# Patient Record
Sex: Female | Born: 1960 | Race: White | Hispanic: No | Marital: Married | Smoking: Never smoker
Health system: Southern US, Community
[De-identification: ages and names within clinical notes are randomized; demographics above are authoritative.]

## PROBLEM LIST (undated history)

## (undated) DIAGNOSIS — C801 Malignant (primary) neoplasm, unspecified: Secondary | ICD-10-CM

## (undated) DIAGNOSIS — J302 Other seasonal allergic rhinitis: Secondary | ICD-10-CM

## (undated) HISTORY — DX: Other seasonal allergic rhinitis: J30.2

---

## 2004-09-11 ENCOUNTER — Ambulatory Visit: Payer: Self-pay | Admitting: Internal Medicine

## 2006-02-17 ENCOUNTER — Ambulatory Visit: Payer: Self-pay | Admitting: Internal Medicine

## 2007-04-28 ENCOUNTER — Ambulatory Visit: Payer: Self-pay

## 2008-11-01 ENCOUNTER — Ambulatory Visit: Payer: Self-pay

## 2009-11-19 ENCOUNTER — Ambulatory Visit: Payer: Self-pay | Admitting: Internal Medicine

## 2010-01-04 ENCOUNTER — Ambulatory Visit: Payer: Self-pay | Admitting: Gastroenterology

## 2011-06-05 ENCOUNTER — Ambulatory Visit: Payer: Self-pay | Admitting: Internal Medicine

## 2012-04-06 ENCOUNTER — Encounter: Payer: Self-pay | Admitting: Internal Medicine

## 2012-04-06 ENCOUNTER — Telehealth: Payer: Self-pay | Admitting: *Deleted

## 2012-04-06 ENCOUNTER — Encounter: Payer: Self-pay | Admitting: *Deleted

## 2012-04-07 ENCOUNTER — Ambulatory Visit (INDEPENDENT_AMBULATORY_CARE_PROVIDER_SITE_OTHER): Payer: Self-pay | Admitting: Internal Medicine

## 2012-04-07 ENCOUNTER — Encounter: Payer: Self-pay | Admitting: Internal Medicine

## 2012-04-07 ENCOUNTER — Other Ambulatory Visit (HOSPITAL_COMMUNITY)
Admission: RE | Admit: 2012-04-07 | Discharge: 2012-04-07 | Disposition: A | Discharge status: Home / Self Care | Payer: Self-pay | Source: Ambulatory Visit | Attending: Internal Medicine | Admitting: Internal Medicine

## 2012-04-07 VITALS — BP 112/70 | HR 54 | Temp 98.2°F | Ht 63.0 in | Wt 122.8 lb

## 2012-04-07 DIAGNOSIS — N926 Irregular menstruation, unspecified: Secondary | ICD-10-CM

## 2012-04-07 DIAGNOSIS — R6889 Other general symptoms and signs: Secondary | ICD-10-CM

## 2012-04-07 DIAGNOSIS — R87619 Unspecified abnormal cytological findings in specimens from cervix uteri: Secondary | ICD-10-CM | POA: Insufficient documentation

## 2012-04-07 DIAGNOSIS — R195 Other fecal abnormalities: Secondary | ICD-10-CM

## 2012-04-07 DIAGNOSIS — IMO0002 Reserved for concepts with insufficient information to code with codable children: Secondary | ICD-10-CM

## 2012-04-07 DIAGNOSIS — Z139 Encounter for screening, unspecified: Secondary | ICD-10-CM

## 2012-04-07 DIAGNOSIS — Z01419 Encounter for gynecological examination (general) (routine) without abnormal findings: Secondary | ICD-10-CM | POA: Insufficient documentation

## 2012-04-07 DIAGNOSIS — Z1151 Encounter for screening for human papillomavirus (HPV): Secondary | ICD-10-CM | POA: Insufficient documentation

## 2012-04-07 LAB — CBC WITH DIFFERENTIAL/PLATELET
Basophils Relative: 0.5 % (ref 0.0–3.0)
Eosinophils Relative: 4.6 % (ref 0.0–5.0)
Lymphocytes Relative: 23.3 % (ref 12.0–46.0)
MCV: 86.7 fl (ref 78.0–100.0)
Monocytes Absolute: 0.5 10*3/uL (ref 0.1–1.0)
Monocytes Relative: 8.2 % (ref 3.0–12.0)
Neutrophils Relative %: 63.4 % (ref 43.0–77.0)
RBC: 4.58 Mil/uL (ref 3.87–5.11)
WBC: 6 10*3/uL (ref 4.5–10.5)

## 2012-04-07 NOTE — Progress Notes (Signed)
  Subjective:    Patient ID: Bridget Gordon, female    DOB: June 06, 1960, 52 y.o.   MRN: [id number]  HPI 52 year old female with past history of occasional seasonal allergies who comes in today for her physical exam.  She states she is doing well.  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  Is followed by Dr Caprice Beaver and [facility] and by Dr Roseanne Kaufman here.  Stays active. Runs.  No cardiac symptoms with increased activity or exertion.  States she has not had a period since 8/13.  (some spotting 10/13).  A rare hot flash.  No sleeping problems.  No bowel change.   Past Medical History  Diagnosis Date  . Seasonal allergies     Current Outpatient Prescriptions on File Prior to Visit  Medication Sig Dispense Refill  . calcium carbonate 200 MG capsule Take 250 mg by mouth daily.        Review of Systems Patient denies any headache, lightheadedness or dizziness.  No significant sinus or allergy symptoms.  No chest pain, tightness or palpitations.  No increased shortness of breath, cough or congestion.  No nausea or vomiting.  No acid reflux.  No abdominal pain or cramping.  No bowel change, such as diarrhea, constipation, BRBPR or melana.  No urine change.  No vaginal problems.  Periods as outlined.       Objective:   Physical Exam Filed Vitals:   04/07/12 0842  BP: 112/70  Pulse: 54  Temp: 98.2 F (47.80 C)   52 year old female in no acute distress.   HEENT:  Nares- clear.  Oropharynx - without lesions. NECK:  Supple.  Nontender.  No audible bruit.  HEART:  Appears to be regular. LUNGS:  No crackles or wheezing audible.  Respirations even and unlabored.  RADIAL PULSE:  Equal bilaterally.    BREASTS:  No nipple discharge or nipple retraction present.  Could not appreciate any distinct nodules or axillary adenopathy.  ABDOMEN:  Soft, nontender.  Bowel sounds present and normal.  No audible abdominal bruit.  GU:  Normal external genitalia.  Vaginal vault without lesions.  Cervix  identified.  Pap performed. Could not appreciate any adnexal masses or tenderness.   RECTAL:  Heme positive.   EXTREMITIES:  No increased edema present.  DP pulses palpable and equal bilaterally.           Assessment & Plan:  CARDIOVASCULAR.  Asymptomatic.   HEME POSITIVE.  Heme positive on exam today.  Check cbc/ferritin.  IFOB.  Discuss with GI. Colonoscopy 01/04/10 - diverticulosis.    HEALTH MAINTENANCE.  Physical today.  Pap today.  Schedule mammogram.  Last mammogram 06/05/11.  Colonoscopy 01/04/10 - diverticulosis otherwise normal.

## 2012-04-07 NOTE — Assessment & Plan Note (Signed)
History of ASCUS.  Last pap negative with negative HPV.   Pap today.

## 2012-04-07 NOTE — Assessment & Plan Note (Signed)
Periods as outlined.  Follow.    

## 2012-04-07 NOTE — Telephone Encounter (Signed)
error 

## 2012-04-11 ENCOUNTER — Telehealth: Payer: Self-pay | Admitting: Internal Medicine

## 2012-04-11 NOTE — Telephone Encounter (Signed)
Notified of labs and pap results via my chart.

## 2012-04-13 ENCOUNTER — Other Ambulatory Visit (INDEPENDENT_AMBULATORY_CARE_PROVIDER_SITE_OTHER): Payer: Self-pay

## 2012-04-13 DIAGNOSIS — Z139 Encounter for screening, unspecified: Secondary | ICD-10-CM

## 2012-04-13 LAB — FECAL OCCULT BLOOD, IMMUNOCHEMICAL: Fecal Occult Bld: NEGATIVE

## 2012-04-14 ENCOUNTER — Telehealth: Payer: Self-pay | Admitting: Internal Medicine

## 2012-04-14 ENCOUNTER — Encounter: Payer: Self-pay | Admitting: Internal Medicine

## 2012-04-14 NOTE — Telephone Encounter (Signed)
Pt notified of lab results via mychart. 

## 2012-04-15 ENCOUNTER — Telehealth: Payer: Self-pay | Admitting: Internal Medicine

## 2012-04-15 DIAGNOSIS — R195 Other fecal abnormalities: Secondary | ICD-10-CM

## 2012-04-15 NOTE — Telephone Encounter (Signed)
Called pt and discussed referral to gi.  She is in agreement.  Order for referral placed.

## 2012-04-17 ENCOUNTER — Other Ambulatory Visit: Payer: Self-pay

## 2013-01-06 ENCOUNTER — Other Ambulatory Visit: Payer: Self-pay

## 2013-04-08 ENCOUNTER — Encounter: Payer: Self-pay | Admitting: Internal Medicine

## 2013-04-15 ENCOUNTER — Encounter: Payer: Self-pay | Admitting: Internal Medicine

## 2013-04-15 ENCOUNTER — Ambulatory Visit (INDEPENDENT_AMBULATORY_CARE_PROVIDER_SITE_OTHER): Payer: Self-pay | Admitting: Internal Medicine

## 2013-04-15 VITALS — BP 120/80 | HR 59 | Temp 98.2°F | Ht 63.5 in | Wt 126.0 lb

## 2013-04-15 DIAGNOSIS — Z13220 Encounter for screening for lipoid disorders: Secondary | ICD-10-CM

## 2013-04-15 DIAGNOSIS — N951 Menopausal and female climacteric states: Secondary | ICD-10-CM

## 2013-04-15 DIAGNOSIS — Z1211 Encounter for screening for malignant neoplasm of colon: Secondary | ICD-10-CM

## 2013-04-15 DIAGNOSIS — N926 Irregular menstruation, unspecified: Secondary | ICD-10-CM

## 2013-04-15 DIAGNOSIS — R232 Flushing: Secondary | ICD-10-CM

## 2013-04-15 NOTE — Assessment & Plan Note (Addendum)
Periods as outlined.  Follow.

## 2013-04-15 NOTE — Progress Notes (Signed)
Pre-visit discussion using our clinic review tool. No additional management support is needed unless otherwise documented below in the visit note.  

## 2013-04-15 NOTE — Assessment & Plan Note (Addendum)
History of ASCUS.  Last pap negative with negative HPV.   Pap 04/07/12 negative with negative HPV.

## 2013-04-18 ENCOUNTER — Encounter: Payer: Self-pay | Admitting: Internal Medicine

## 2013-04-19 ENCOUNTER — Encounter: Payer: Self-pay | Admitting: Internal Medicine

## 2013-04-19 DIAGNOSIS — R232 Flushing: Secondary | ICD-10-CM | POA: Insufficient documentation

## 2013-04-19 NOTE — Assessment & Plan Note (Signed)
Discussed at length with her today.  Discussed treatment options.  She preferred to discuss more with Chapman Fitch.

## 2013-04-19 NOTE — Progress Notes (Signed)
  Subjective:    Patient ID: Bridget Gordon, female    DOB: 12-25-1960, 53 y.o.   MRN: [id number]  HPI 53 year old female with past history of occasional seasonal allergies who comes in today for her physical exam.  She states she is doing well.  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  Is followed by Dr Anne Fu at [facility] and by Dr Kellie Moor here.  Stays active. Runs.  No cardiac symptoms with increased activity or exertion.  States she had not had a period since 8/13 except for some spotting 10/13.  She then had a normal period 8/14.  No bleeding since.  does have some hot flashes.  We discussed treatment options.  She preferred to talk with Chapman Fitch for evaluation.  No sleeping problems.  No bowel change.    Past Medical History  Diagnosis Date  . Seasonal allergies     Current Outpatient Prescriptions on File Prior to Visit  Medication Sig Dispense Refill  . calcium carbonate 200 MG capsule Take 250 mg by mouth daily.      Marland Kitchen VITAMIN D, ERGOCALCIFEROL, PO Take by mouth daily.       No current facility-administered medications on file prior to visit.    Review of Systems Patient denies any headache, lightheadedness or dizziness.  No significant sinus or allergy symptoms.  No chest pain, tightness or palpitations.  No increased shortness of breath, cough or congestion.  No nausea or vomiting.  No acid reflux.  No abdominal pain or cramping.  No bowel change, such as diarrhea, constipation, BRBPR or melana.  No urine change.  No vaginal problems.  Periods as outlined.  Some hot flashes.       Objective:   Physical Exam  Filed Vitals:   04/15/13 1515  BP: 120/80  Pulse: 59  Temp: 98.2 F (39.6 C)   53 year old female in no acute distress.   HEENT:  Nares- clear.  Oropharynx - without lesions. NECK:  Supple.  Nontender.  No audible bruit.  HEART:  Appears to be regular. LUNGS:  No crackles or wheezing audible.  Respirations even and unlabored.  RADIAL PULSE:  Equal  bilaterally.    BREASTS:  No nipple discharge or nipple retraction present.  Could not appreciate any distinct nodules or axillary adenopathy.  ABDOMEN:  Soft, nontender.  Bowel sounds present and normal.  No audible abdominal bruit.  GU:  Not performed.    EXTREMITIES:  No increased edema present.  DP pulses palpable and equal bilaterally.           Assessment & Plan:  CARDIOVASCULAR.  Asymptomatic.   GI.   IFOB.  Colonoscopy 01/04/10 - diverticulosis.    HEALTH MAINTENANCE.  Physical today.  Pap 04/07/12 negative with negative HPV.   Schedule mammogram.  Last mammogram 06/05/11.  Did not get her mammogram last year.  Overdue.  Colonoscopy 01/04/10 - diverticulosis otherwise normal.

## 2013-04-28 ENCOUNTER — Other Ambulatory Visit: Payer: Self-pay

## 2013-05-04 ENCOUNTER — Ambulatory Visit: Payer: Self-pay | Admitting: Internal Medicine

## 2013-05-04 LAB — HM MAMMOGRAPHY: HM MAMMO: NEGATIVE

## 2013-05-05 ENCOUNTER — Encounter: Payer: Self-pay | Admitting: Internal Medicine

## 2013-05-06 ENCOUNTER — Other Ambulatory Visit (INDEPENDENT_AMBULATORY_CARE_PROVIDER_SITE_OTHER): Payer: Self-pay

## 2013-05-06 DIAGNOSIS — Z13220 Encounter for screening for lipoid disorders: Secondary | ICD-10-CM

## 2013-05-06 DIAGNOSIS — N926 Irregular menstruation, unspecified: Secondary | ICD-10-CM

## 2013-05-06 LAB — CBC WITH DIFFERENTIAL/PLATELET
Basophils Absolute: 0 K/uL (ref 0.0–0.1)
Basophils Relative: 0.3 % (ref 0.0–3.0)
Eosinophils Absolute: 1 K/uL — ABNORMAL HIGH (ref 0.0–0.7)
Eosinophils Relative: 13.2 % — ABNORMAL HIGH (ref 0.0–5.0)
HCT: 39.3 % (ref 36.0–46.0)
Hemoglobin: 13.3 g/dL (ref 12.0–15.0)
Lymphocytes Relative: 20 % (ref 12.0–46.0)
Lymphs Abs: 1.5 K/uL (ref 0.7–4.0)
MCHC: 33.8 g/dL (ref 30.0–36.0)
MCV: 87.1 fl (ref 78.0–100.0)
Monocytes Absolute: 0.4 K/uL (ref 0.1–1.0)
Monocytes Relative: 5.6 % (ref 3.0–12.0)
Neutro Abs: 4.5 K/uL (ref 1.4–7.7)
Neutrophils Relative %: 60.9 % (ref 43.0–77.0)
Platelets: 288 K/uL (ref 150.0–400.0)
RBC: 4.51 Mil/uL (ref 3.87–5.11)
RDW: 12.7 % (ref 11.5–14.6)
WBC: 7.5 K/uL (ref 4.5–10.5)

## 2013-05-06 LAB — LIPID PANEL
Cholesterol: 197 mg/dL (ref 0–200)
HDL: 67.9 mg/dL (ref >39.00)
LDL Cholesterol: 120 mg/dL — ABNORMAL HIGH (ref 0–99)
Total CHOL/HDL Ratio: 3
Triglycerides: 48 mg/dL (ref 0.0–149.0)
VLDL: 9.6 mg/dL (ref 0.0–40.0)

## 2013-05-06 LAB — COMPREHENSIVE METABOLIC PANEL WITH GFR
ALT: 26 U/L (ref 0–35)
AST: 18 U/L (ref 0–37)
Albumin: 3.9 g/dL (ref 3.5–5.2)
Alkaline Phosphatase: 64 U/L (ref 39–117)
BUN: 9 mg/dL (ref 6–23)
CO2: 32 meq/L (ref 19–32)
Calcium: 8.8 mg/dL (ref 8.4–10.5)
Chloride: 104 meq/L (ref 96–112)
Creatinine, Ser: 0.7 mg/dL (ref 0.4–1.2)
GFR: 91.32 mL/min
Glucose, Bld: 72 mg/dL (ref 70–99)
Potassium: 4 meq/L (ref 3.5–5.1)
Sodium: 139 meq/L (ref 135–145)
Total Bilirubin: 1 mg/dL (ref 0.3–1.2)
Total Protein: 6.7 g/dL (ref 6.0–8.3)

## 2013-05-06 LAB — TSH: TSH: 0.89 u[IU]/mL (ref 0.35–5.50)

## 2013-05-07 ENCOUNTER — Encounter: Payer: Self-pay | Admitting: Internal Medicine

## 2013-05-26 ENCOUNTER — Encounter: Payer: Self-pay | Admitting: Internal Medicine

## 2013-05-26 ENCOUNTER — Other Ambulatory Visit (INDEPENDENT_AMBULATORY_CARE_PROVIDER_SITE_OTHER): Payer: Self-pay

## 2013-05-26 DIAGNOSIS — Z1211 Encounter for screening for malignant neoplasm of colon: Secondary | ICD-10-CM

## 2013-05-26 LAB — FECAL OCCULT BLOOD, IMMUNOCHEMICAL: FECAL OCCULT BLD: NEGATIVE

## 2013-05-31 NOTE — Telephone Encounter (Signed)
Mailed unread message to pt  

## 2014-02-02 ENCOUNTER — Encounter: Payer: Self-pay | Admitting: Internal Medicine

## 2014-04-17 ENCOUNTER — Encounter: Payer: Self-pay | Admitting: Internal Medicine

## 2014-04-20 ENCOUNTER — Encounter: Payer: Self-pay | Admitting: Internal Medicine

## 2014-04-20 ENCOUNTER — Ambulatory Visit (INDEPENDENT_AMBULATORY_CARE_PROVIDER_SITE_OTHER): Payer: Self-pay | Admitting: Internal Medicine

## 2014-04-20 VITALS — BP 118/80 | HR 64 | Temp 98.3°F | Ht 63.0 in | Wt 129.4 lb

## 2014-04-20 DIAGNOSIS — R232 Flushing: Secondary | ICD-10-CM

## 2014-04-20 DIAGNOSIS — N951 Menopausal and female climacteric states: Secondary | ICD-10-CM

## 2014-04-20 DIAGNOSIS — R87619 Unspecified abnormal cytological findings in specimens from cervix uteri: Secondary | ICD-10-CM

## 2014-04-20 DIAGNOSIS — Z0000 Encounter for general adult medical examination without abnormal findings: Secondary | ICD-10-CM

## 2014-04-20 NOTE — Progress Notes (Signed)
Pre visit review using our clinic review tool, if applicable. No additional management support is needed unless otherwise documented below in the visit note. 

## 2014-04-24 ENCOUNTER — Encounter: Payer: Self-pay | Admitting: Internal Medicine

## 2014-04-24 DIAGNOSIS — Z0000 Encounter for general adult medical examination without abnormal findings: Secondary | ICD-10-CM | POA: Insufficient documentation

## 2014-04-24 NOTE — Assessment & Plan Note (Signed)
History of ASCUS.  Last pap 04/07/12 - negative with negative HPV.

## 2014-04-24 NOTE — Progress Notes (Signed)
Patient ID: Bridget Gordon, female   DOB: May 04, 1960, 54 y.o.   MRN: [id number]   Subjective:    Patient ID: Bridget Gordon, female    DOB: 31-Jul-1960, 54 y.o.   MRN: [id number]  HPI  Patient here for her physical exam.  She states she is doing well.  Hot flashes are better.  Sleeping well.  No cardiac symptoms with increased activity or exertion.  Breathing stable.     Past Medical History  Diagnosis Date  . Seasonal allergies     Outpatient Encounter Prescriptions as of 04/20/2014  Medication Sig  . calcium carbonate 200 MG capsule Take 250 mg by mouth daily.  . Multiple Vitamin (MULTIVITAMIN) tablet Take 1 tablet by mouth daily.  Marland Kitchen VITAMIN D, ERGOCALCIFEROL, PO Take by mouth daily.    Review of Systems  Constitutional: Negative for appetite change and unexpected weight change.  HENT: Negative for congestion and sinus pressure.   Respiratory: Negative for cough, chest tightness and shortness of breath.   Cardiovascular: Negative for chest pain, palpitations and leg swelling.  Gastrointestinal: Negative for nausea, vomiting, abdominal pain and diarrhea.  Genitourinary: Negative for dysuria and difficulty urinating.  Neurological: Negative for dizziness, light-headedness and headaches.       Objective:    Physical Exam  Constitutional: She is oriented to person, place, and time. She appears well-developed and well-nourished.  HENT:  Nose: Nose normal.  Mouth/Throat: Oropharynx is clear and moist.  Eyes: Right eye exhibits no discharge. Left eye exhibits no discharge. No scleral icterus.  Neck: Neck supple. No thyromegaly present.  Cardiovascular: Normal rate and regular rhythm.   Pulmonary/Chest: Breath sounds normal. No accessory muscle usage. No tachypnea. No respiratory distress. She has no decreased breath sounds. She has no wheezes. She has no rhonchi. Right breast exhibits no inverted nipple, no mass, no nipple discharge and no tenderness (no axillary adenopathy). Left  breast exhibits no inverted nipple, no mass, no nipple discharge and no tenderness (no axilarry adenopathy).  Abdominal: Soft. Bowel sounds are normal. There is no tenderness.  Musculoskeletal: She exhibits no edema or tenderness.  Lymphadenopathy:    She has no cervical adenopathy.  Neurological: She is alert and oriented to person, place, and time.  Skin: Skin is warm. No rash noted.  Psychiatric: She has a normal mood and affect. Her behavior is normal.    BP 118/80 mmHg  Pulse 64  Temp(Src) 98.3 F (36.8 C) (Oral)  Ht 5\' 3"  (1.6 m)  Wt 129 lb 6 oz (58.684 kg)  BMI 22.92 kg/m2  SpO2 97%  LMP 10/13/2012 Wt Readings from Last 3 Encounters:  04/20/14 129 lb 6 oz (58.684 kg)  04/15/13 126 lb (57.153 kg)  04/07/12 122 lb 12 oz (55.679 kg)     Lab Results  Component Value Date   WBC 7.5 05/06/2013   HGB 13.3 05/06/2013   HCT 39.3 05/06/2013   PLT 288.0 05/06/2013   GLUCOSE 72 05/06/2013   CHOL 197 05/06/2013   TRIG 48.0 05/06/2013   HDL 67.90 05/06/2013   LDLCALC 120* 05/06/2013   ALT 26 05/06/2013   AST 18 05/06/2013   NA 139 05/06/2013   K 4.0 05/06/2013   CL 104 05/06/2013   CREATININE 0.7 05/06/2013   BUN 9 05/06/2013   CO2 32 05/06/2013   TSH 0.89 05/06/2013       Assessment & Plan:   Problem List Items Addressed This Visit    Abnormal Pap smear of cervix - Primary  History of ASCUS.  Last pap 04/07/12 - negative with negative HPV.        Health care maintenance    Mammogram 05/04/13 - Birads I.  Schedule f/u mammogram.  PAP 04/07/12 negative pap with negative HPV.  Colonoscopy 01/04/10.        Hot flashes    Better now.  Sleeping well.           Einar Pheasant, MD

## 2014-04-24 NOTE — Assessment & Plan Note (Signed)
Mammogram 05/04/13 - Birads I.  Schedule f/u mammogram.  PAP 04/07/12 negative pap with negative HPV.  Colonoscopy 01/04/10.

## 2014-04-24 NOTE — Assessment & Plan Note (Signed)
Better now.  Sleeping well.

## 2014-05-31 ENCOUNTER — Ambulatory Visit
Admit: 2014-05-31 | Disposition: A | Discharge status: Home / Self Care | Payer: Self-pay | Attending: Internal Medicine | Admitting: Internal Medicine

## 2014-05-31 LAB — HM MAMMOGRAPHY: HM MAMMO: NEGATIVE

## 2014-06-27 ENCOUNTER — Encounter: Payer: Self-pay | Admitting: Internal Medicine

## 2015-04-23 ENCOUNTER — Encounter: Payer: Self-pay | Admitting: Internal Medicine

## 2015-04-23 ENCOUNTER — Other Ambulatory Visit (HOSPITAL_COMMUNITY)
Admission: RE | Admit: 2015-04-23 | Discharge: 2015-04-23 | Disposition: A | Discharge status: Home / Self Care | Payer: Self-pay | Source: Ambulatory Visit | Attending: Internal Medicine | Admitting: Internal Medicine

## 2015-04-23 ENCOUNTER — Ambulatory Visit (INDEPENDENT_AMBULATORY_CARE_PROVIDER_SITE_OTHER): Payer: Self-pay | Admitting: Internal Medicine

## 2015-04-23 VITALS — BP 120/80 | HR 67 | Temp 98.2°F | Resp 18 | Ht 63.0 in | Wt 127.0 lb

## 2015-04-23 DIAGNOSIS — R195 Other fecal abnormalities: Secondary | ICD-10-CM

## 2015-04-23 DIAGNOSIS — Z0000 Encounter for general adult medical examination without abnormal findings: Secondary | ICD-10-CM

## 2015-04-23 DIAGNOSIS — Z1151 Encounter for screening for human papillomavirus (HPV): Secondary | ICD-10-CM | POA: Insufficient documentation

## 2015-04-23 DIAGNOSIS — Z124 Encounter for screening for malignant neoplasm of cervix: Secondary | ICD-10-CM

## 2015-04-23 DIAGNOSIS — Z01419 Encounter for gynecological examination (general) (routine) without abnormal findings: Secondary | ICD-10-CM | POA: Insufficient documentation

## 2015-04-23 DIAGNOSIS — R87619 Unspecified abnormal cytological findings in specimens from cervix uteri: Secondary | ICD-10-CM

## 2015-04-23 DIAGNOSIS — Z13220 Encounter for screening for lipoid disorders: Secondary | ICD-10-CM

## 2015-04-23 DIAGNOSIS — Z1239 Encounter for other screening for malignant neoplasm of breast: Secondary | ICD-10-CM

## 2015-04-23 NOTE — Assessment & Plan Note (Signed)
History of ASCUS.  Last pap 04/07/12 - negative with negative HPV.  Repeat pap today 04/23/15.

## 2015-04-23 NOTE — Assessment & Plan Note (Signed)
Noted on exam.  Last colonoscopy 2011.  Refer to GI for evaluation and question of need for colonoscopy.  Check cbc and ferritin.

## 2015-04-23 NOTE — Progress Notes (Signed)
Pre-visit discussion using our clinic review tool. No additional management support is needed unless otherwise documented below in the visit note.  

## 2015-04-23 NOTE — Assessment & Plan Note (Signed)
Physical today 04/23/15.  PAP 04/23/15.  Colonoscopy 01/04/10.  Recommended f/u in 10 years.  Heme positive on exam.  Refer to GI for evaluation and question of need for f/u colonoscopy.  She will schedule mammogram.  Order placed.

## 2015-04-23 NOTE — Progress Notes (Signed)
Patient ID: VASHTIE FORST, female   DOB: Oct 10, 1960, 55 y.o.   MRN: [id number]   Subjective:    Patient ID: MYLO KOWALKE, female    DOB: 1960/09/11, 55 y.o.   MRN: [id number]  HPI  Patient here for her physical exam.  She is doing well.  Feels good.  Stays active.  No cardiac symptoms with increased activity or exertion.  No sob.  No significant cough or congestion.  No abdominal pain or cramping.  Bowels doing well.  No vaginal problems.  No vaginal bleeding.  Has been 18 months since last period.  Occasional problems with hemorrhoids.  No significant bowel issues.     Past Medical History  Diagnosis Date  . Seasonal allergies    No past surgical history on file. Family History  Problem Relation Age of Onset  . Heart disease Father     s/p CABG  . Skin cancer Father   . Skin cancer Mother   . Diabetes Maternal Grandmother   . Bone cancer Maternal Grandmother   . Liver cancer Maternal Grandfather   . Breast cancer Neg Hx   . Colon cancer Neg Hx    Social History   Social History  . Marital Status: Married    Spouse Name: N/A  . Number of Children: 7  . Years of Education: N/A   Social History Main Topics  . Smoking status: Never Smoker   . Smokeless tobacco: Never Used  . Alcohol Use: No  . Drug Use: No  . Sexual Activity: Not Asked   Other Topics Concern  . None   Social History Narrative    Outpatient Encounter Prescriptions as of 04/23/2015  Medication Sig  . calcium carbonate 200 MG capsule Take 250 mg by mouth daily.  . Multiple Vitamin (MULTIVITAMIN) tablet Take 1 tablet by mouth daily.  Marland Kitchen VITAMIN D, ERGOCALCIFEROL, PO Take by mouth daily.   No facility-administered encounter medications on file as of 04/23/2015.    Review of Systems  Constitutional: Negative for appetite change and unexpected weight change.  HENT: Negative for congestion and sinus pressure.   Eyes: Negative for pain and visual disturbance.  Respiratory: Negative for cough, chest  tightness and shortness of breath.   Cardiovascular: Negative for chest pain, palpitations and leg swelling.  Gastrointestinal: Negative for nausea, vomiting, abdominal pain and diarrhea.  Genitourinary: Negative for dysuria, vaginal bleeding and difficulty urinating.  Musculoskeletal: Negative for back pain and joint swelling.  Skin: Negative for color change and rash.  Neurological: Negative for dizziness, light-headedness and headaches.  Hematological: Negative for adenopathy. Does not bruise/bleed easily.  Psychiatric/Behavioral: Negative for dysphoric mood and agitation.       Objective:    Physical Exam  Constitutional: She is oriented to person, place, and time. She appears well-developed and well-nourished. No distress.  HENT:  Nose: Nose normal.  Mouth/Throat: Oropharynx is clear and moist.  Eyes: Right eye exhibits no discharge. Left eye exhibits no discharge. No scleral icterus.  Neck: Neck supple. No thyromegaly present.  Cardiovascular: Normal rate and regular rhythm.   Pulmonary/Chest: Breath sounds normal. No accessory muscle usage. No tachypnea. No respiratory distress. She has no decreased breath sounds. She has no wheezes. She has no rhonchi. Right breast exhibits no inverted nipple, no mass, no nipple discharge and no tenderness (no axillary adenopathy). Left breast exhibits no inverted nipple, no mass, no nipple discharge and no tenderness (no axilarry adenopathy).  Abdominal: Soft. Bowel sounds are normal. There is no  tenderness.  Genitourinary:  Normal external genitalia.  Vaginal vault without lesions.  Cervix identified.  Pap smear performed.  Could not appreciate any adnexal masses or tenderness.  Rectal exam - heme positive.    Musculoskeletal: She exhibits no edema or tenderness.  Lymphadenopathy:    She has no cervical adenopathy.  Neurological: She is alert and oriented to person, place, and time.  Skin: Skin is warm. No rash noted. No erythema.    Psychiatric: She has a normal mood and affect. Her behavior is normal.    BP 120/80 mmHg  Pulse 67  Temp(Src) 98.2 F (36.8 C) (Oral)  Resp 18  Ht 5\' 3"  (1.6 m)  Wt 127 lb (57.607 kg)  BMI 22.50 kg/m2  SpO2 98%  LMP 10/13/2012 Wt Readings from Last 3 Encounters:  04/23/15 127 lb (57.607 kg)  04/20/14 129 lb 6 oz (58.684 kg)  04/15/13 126 lb (57.153 kg)     Lab Results  Component Value Date   WBC 7.5 05/06/2013   HGB 13.3 05/06/2013   HCT 39.3 05/06/2013   PLT 288.0 05/06/2013   GLUCOSE 72 05/06/2013   CHOL 197 05/06/2013   TRIG 48.0 05/06/2013   HDL 67.90 05/06/2013   LDLCALC 120* 05/06/2013   ALT 26 05/06/2013   AST 18 05/06/2013   NA 139 05/06/2013   K 4.0 05/06/2013   CL 104 05/06/2013   CREATININE 0.7 05/06/2013   BUN 9 05/06/2013   CO2 32 05/06/2013   TSH 0.89 05/06/2013       Assessment & Plan:   Problem List Items Addressed This Visit    Abnormal Pap smear of cervix    History of ASCUS.  Last pap 04/07/12 - negative with negative HPV.  Repeat pap today 04/23/15.        Health care maintenance - Primary    Physical today 04/23/15.  PAP 04/23/15.  Colonoscopy 01/04/10.  Recommended f/u in 10 years.  Heme positive on exam.  Refer to GI for evaluation and question of need for f/u colonoscopy.  She will schedule mammogram.  Order placed.        Heme positive stool    Noted on exam.  Last colonoscopy 2011.  Refer to GI for evaluation and question of need for colonoscopy.  Check cbc and ferritin.        Relevant Orders   CBC with Differential/Platelet   Comprehensive metabolic panel   TSH   Ferritin   Ambulatory referral to Gastroenterology    Other Visit Diagnoses    Pap smear for cervical cancer screening        Relevant Orders    Cytology - PAP    Screening breast examination        Relevant Orders    MM DIGITAL SCREENING BILATERAL    Screening cholesterol level        Relevant Orders    Lipid panel        Einar Pheasant, MD

## 2015-04-26 LAB — CYTOLOGY - PAP

## 2015-04-27 ENCOUNTER — Encounter: Payer: Self-pay | Admitting: Internal Medicine

## 2015-05-08 ENCOUNTER — Encounter: Payer: Self-pay | Admitting: Internal Medicine

## 2015-05-09 ENCOUNTER — Encounter: Payer: Self-pay | Admitting: Internal Medicine

## 2015-05-09 NOTE — Telephone Encounter (Signed)
Pt states that she has had a stye since last week she started using warm compressed but by this weekend it got worse. Pt has swelling of the eyelid, under eye swelling, puffiness. Pt thinks it may be infected. Please advise, thanks

## 2015-05-09 NOTE — Telephone Encounter (Signed)
I can see her tomorrow at 1:15

## 2015-05-10 ENCOUNTER — Ambulatory Visit: Payer: Self-pay | Admitting: Family Medicine

## 2015-05-10 ENCOUNTER — Ambulatory Visit: Payer: Self-pay | Admitting: Internal Medicine

## 2015-05-10 ENCOUNTER — Encounter: Payer: Self-pay | Admitting: Internal Medicine

## 2015-05-11 ENCOUNTER — Other Ambulatory Visit (INDEPENDENT_AMBULATORY_CARE_PROVIDER_SITE_OTHER): Payer: Self-pay

## 2015-05-11 DIAGNOSIS — R195 Other fecal abnormalities: Secondary | ICD-10-CM

## 2015-05-11 DIAGNOSIS — Z13220 Encounter for screening for lipoid disorders: Secondary | ICD-10-CM

## 2015-05-11 LAB — LIPID PANEL
CHOL/HDL RATIO: 3
CHOLESTEROL: 195 mg/dL (ref 0–200)
HDL: 66 mg/dL (ref >39.00)
LDL CALC: 122 mg/dL — AB (ref 0–99)
NonHDL: 129.43
TRIGLYCERIDES: 39 mg/dL (ref 0.0–149.0)
VLDL: 7.8 mg/dL (ref 0.0–40.0)

## 2015-05-11 LAB — COMPREHENSIVE METABOLIC PANEL
ALT: 24 U/L (ref 0–35)
AST: 24 U/L (ref 0–37)
Albumin: 4.4 g/dL (ref 3.5–5.2)
Alkaline Phosphatase: 79 U/L (ref 39–117)
BUN: 13 mg/dL (ref 6–23)
CALCIUM: 9.3 mg/dL (ref 8.4–10.5)
CO2: 30 meq/L (ref 19–32)
Chloride: 103 mEq/L (ref 96–112)
Creatinine, Ser: 0.78 mg/dL (ref 0.40–1.20)
GFR: 81.32 mL/min (ref >60.00)
GLUCOSE: 81 mg/dL (ref 70–99)
POTASSIUM: 4.1 meq/L (ref 3.5–5.1)
Sodium: 140 mEq/L (ref 135–145)
Total Bilirubin: 0.5 mg/dL (ref 0.2–1.2)
Total Protein: 7.3 g/dL (ref 6.0–8.3)

## 2015-05-11 LAB — CBC WITH DIFFERENTIAL/PLATELET
BASOS PCT: 0.6 % (ref 0.0–3.0)
Basophils Absolute: 0 10*3/uL (ref 0.0–0.1)
EOS PCT: 5.4 % — AB (ref 0.0–5.0)
Eosinophils Absolute: 0.3 10*3/uL (ref 0.0–0.7)
HEMATOCRIT: 39.2 % (ref 36.0–46.0)
Hemoglobin: 13.3 g/dL (ref 12.0–15.0)
LYMPHS ABS: 1.7 10*3/uL (ref 0.7–4.0)
Lymphocytes Relative: 28.8 % (ref 12.0–46.0)
MCHC: 33.9 g/dL (ref 30.0–36.0)
MCV: 84.3 fl (ref 78.0–100.0)
MONOS PCT: 6.6 % (ref 3.0–12.0)
Monocytes Absolute: 0.4 10*3/uL (ref 0.1–1.0)
NEUTROS ABS: 3.4 10*3/uL (ref 1.4–7.7)
NEUTROS PCT: 58.6 % (ref 43.0–77.0)
PLATELETS: 295 10*3/uL (ref 150.0–400.0)
RBC: 4.65 Mil/uL (ref 3.87–5.11)
RDW: 12.8 % (ref 11.5–15.5)
WBC: 5.8 10*3/uL (ref 4.0–10.5)

## 2015-05-11 LAB — TSH: TSH: 0.8 u[IU]/mL (ref 0.35–4.50)

## 2015-05-11 LAB — FERRITIN: FERRITIN: 12.1 ng/mL (ref 10.0–291.0)

## 2015-05-12 ENCOUNTER — Encounter: Payer: Self-pay | Admitting: Internal Medicine

## 2015-06-04 ENCOUNTER — Ambulatory Visit
Admission: RE | Admit: 2015-06-04 | Discharge: 2015-06-04 | Disposition: A | Discharge status: Home / Self Care | Payer: Self-pay | Source: Ambulatory Visit | Attending: Internal Medicine | Admitting: Internal Medicine

## 2015-06-04 DIAGNOSIS — Z1239 Encounter for other screening for malignant neoplasm of breast: Secondary | ICD-10-CM

## 2015-06-04 DIAGNOSIS — Z1231 Encounter for screening mammogram for malignant neoplasm of breast: Secondary | ICD-10-CM | POA: Insufficient documentation

## 2015-06-04 HISTORY — DX: Malignant (primary) neoplasm, unspecified: C80.1

## 2015-06-05 ENCOUNTER — Encounter: Payer: Self-pay | Admitting: Internal Medicine

## 2015-07-18 LAB — HM COLONOSCOPY

## 2016-04-23 ENCOUNTER — Encounter: Payer: Self-pay | Admitting: Internal Medicine

## 2016-04-23 ENCOUNTER — Ambulatory Visit (INDEPENDENT_AMBULATORY_CARE_PROVIDER_SITE_OTHER): Payer: Self-pay | Admitting: Internal Medicine

## 2016-04-23 VITALS — BP 118/76 | HR 63 | Temp 98.3°F | Resp 16 | Ht 63.0 in | Wt 127.2 lb

## 2016-04-23 DIAGNOSIS — Z1231 Encounter for screening mammogram for malignant neoplasm of breast: Secondary | ICD-10-CM

## 2016-04-23 DIAGNOSIS — Z1239 Encounter for other screening for malignant neoplasm of breast: Secondary | ICD-10-CM

## 2016-04-23 DIAGNOSIS — Z0000 Encounter for general adult medical examination without abnormal findings: Secondary | ICD-10-CM

## 2016-04-23 DIAGNOSIS — H9319 Tinnitus, unspecified ear: Secondary | ICD-10-CM

## 2016-04-23 DIAGNOSIS — E611 Iron deficiency: Secondary | ICD-10-CM

## 2016-04-23 DIAGNOSIS — Z8639 Personal history of other endocrine, nutritional and metabolic disease: Secondary | ICD-10-CM | POA: Insufficient documentation

## 2016-04-23 LAB — CBC WITH DIFFERENTIAL/PLATELET
Basophils Absolute: 0 10*3/uL (ref 0.0–0.1)
Basophils Relative: 0.6 % (ref 0.0–3.0)
EOS ABS: 0.3 10*3/uL (ref 0.0–0.7)
Eosinophils Relative: 5.2 % — ABNORMAL HIGH (ref 0.0–5.0)
HEMATOCRIT: 41.4 % (ref 36.0–46.0)
HEMOGLOBIN: 14 g/dL (ref 12.0–15.0)
Lymphocytes Relative: 24.3 % (ref 12.0–46.0)
Lymphs Abs: 1.5 10*3/uL (ref 0.7–4.0)
MCHC: 33.8 g/dL (ref 30.0–36.0)
MCV: 85.9 fl (ref 78.0–100.0)
MONOS PCT: 9.7 % (ref 3.0–12.0)
Monocytes Absolute: 0.6 10*3/uL (ref 0.1–1.0)
Neutro Abs: 3.6 10*3/uL (ref 1.4–7.7)
Neutrophils Relative %: 60.2 % (ref 43.0–77.0)
PLATELETS: 293 10*3/uL (ref 150.0–400.0)
RBC: 4.82 Mil/uL (ref 3.87–5.11)
RDW: 12.3 % (ref 11.5–15.5)
WBC: 6 10*3/uL (ref 4.0–10.5)

## 2016-04-23 LAB — FERRITIN: Ferritin: 29.9 ng/mL (ref 10.0–291.0)

## 2016-04-23 NOTE — Progress Notes (Signed)
Patient ID: Bridget Gordon, female   DOB: 1960/07/28, 56 y.o.   MRN: [id number]   Subjective:    Patient ID: Bridget Gordon, female    DOB: [birth year], 57 y.o.   MRN: [id number]  HPI  Patient here for her physical exam.  She reports she feels good.  Stays active.  No chest pain.  No sob.  No acid reflux.  No abdominal pain or cramping.  Bowels stable.  Had shingles.  Treated with famvir.  Doing well.  Has noticed ringing in her ears.  Present now for one month.  Discussed ent referral.  She saw Dr Heide Spark.  Has cataract - right.  Following.  Had colonoscopy 07/2015.  States ok.  Still taking iron.  Overall feels she is doing well.    Past Medical History:  Diagnosis Date  . Cancer ([place])    skin  . Seasonal allergies    History reviewed. No pertinent surgical history. Family History  Problem Relation Age of Onset  . Heart disease Father     s/p CABG  . Skin cancer Father   . Skin cancer Mother   . Diabetes Maternal Grandmother   . Bone cancer Maternal Grandmother   . Liver cancer Maternal Grandfather   . Breast cancer Neg Hx   . Colon cancer Neg Hx    Social History   Social History  . Marital status: Married    Spouse name: N/A  . Number of children: 7  . Years of education: N/A   Social History Main Topics  . Smoking status: Never Smoker  . Smokeless tobacco: Never Used  . Alcohol use No  . Drug use: Yes    Types: Fentanyl  . Sexual activity: Not Asked   Other Topics Concern  . None   Social History Narrative  . None    Outpatient Encounter Prescriptions as of 04/23/2016  Medication Sig  . calcium carbonate 200 MG capsule Take 250 mg by mouth daily.  . ferrous sulfate 325 (65 FE) MG EC tablet Take 325 mg by mouth 3 (three) times daily with meals.  . Multiple Vitamin (MULTIVITAMIN) tablet Take 1 tablet by mouth daily.  Marland Kitchen VITAMIN D, ERGOCALCIFEROL, PO Take by mouth daily.   No facility-administered encounter medications on file as of 04/23/2016.     Review of  Systems  Constitutional: Negative for appetite change and unexpected weight change.  HENT: Positive for tinnitus. Negative for congestion and sinus pressure.   Eyes: Negative for pain and visual disturbance.  Respiratory: Negative for cough, chest tightness and shortness of breath.   Cardiovascular: Negative for chest pain, palpitations and leg swelling.  Gastrointestinal: Negative for abdominal pain, diarrhea, nausea and vomiting.  Genitourinary: Negative for difficulty urinating and dysuria.  Musculoskeletal: Negative for back pain and joint swelling.  Skin: Negative for color change and rash.  Neurological: Negative for dizziness, light-headedness and headaches.  Hematological: Negative for adenopathy. Does not bruise/bleed easily.  Psychiatric/Behavioral: Negative for agitation and dysphoric mood.       Objective:    Physical Exam  Constitutional: She is oriented to person, place, and time. She appears well-developed and well-nourished. No distress.  HENT:  Nose: Nose normal.  Mouth/Throat: Oropharynx is clear and moist.  Eyes: Right eye exhibits no discharge. Left eye exhibits no discharge. No scleral icterus.  Neck: Neck supple. No thyromegaly present.  Cardiovascular: Normal rate and regular rhythm.   Pulmonary/Chest: Breath sounds normal. No accessory muscle usage. No tachypnea. No respiratory distress.  She has no decreased breath sounds. She has no wheezes. She has no rhonchi. Right breast exhibits no inverted nipple, no mass, no nipple discharge and no tenderness (no axillary adenopathy). Left breast exhibits no inverted nipple, no mass, no nipple discharge and no tenderness (no axilarry adenopathy).  Abdominal: Soft. Bowel sounds are normal. There is no tenderness.  Musculoskeletal: She exhibits no edema or tenderness.  Lymphadenopathy:    She has no cervical adenopathy.  Neurological: She is alert and oriented to person, place, and time.  Skin: Skin is warm. No rash noted.  No erythema.  Psychiatric: She has a normal mood and affect. Her behavior is normal.    BP 118/76 (BP Location: Left Arm, Patient Position: Sitting, Cuff Size: Large)   Pulse 63   Temp 98.3 F (36.8 C) (Oral)   Resp 16   Ht 5\' 3"  (1.6 m)   Wt 127 lb 3.2 oz (57.7 kg)   LMP 10/13/2012   SpO2 98%   BMI 22.53 kg/m  Wt Readings from Last 3 Encounters:  04/23/16 127 lb 3.2 oz (57.7 kg)  04/23/15 127 lb (57.6 kg)  04/20/14 129 lb 6 oz (58.7 kg)     Lab Results  Component Value Date   WBC 6.0 04/23/2016   HGB 14.0 04/23/2016   HCT 41.4 04/23/2016   PLT 293.0 04/23/2016   GLUCOSE 81 05/11/2015   CHOL 195 05/11/2015   TRIG 39.0 05/11/2015   HDL 66.00 05/11/2015   LDLCALC 122 (H) 05/11/2015   ALT 24 05/11/2015   AST 24 05/11/2015   NA 140 05/11/2015   K 4.1 05/11/2015   CL 103 05/11/2015   CREATININE 0.78 05/11/2015   BUN 13 05/11/2015   CO2 30 05/11/2015   TSH 0.80 05/11/2015    Mm Digital Screening Bilateral  Result Date: 06/04/2015 CLINICAL DATA:  Screening. EXAM: DIGITAL SCREENING BILATERAL MAMMOGRAM WITH CAD COMPARISON:  Previous exam(s). ACR Breast Density Category c: The breast tissue is heterogeneously dense, which may obscure small masses. FINDINGS: There are no findings suspicious for malignancy. Images were processed with CAD. IMPRESSION: No mammographic evidence of malignancy. A result letter of this screening mammogram will be mailed directly to the patient. RECOMMENDATION: Screening mammogram in one year. (Code:SM-B-01Y) BI-RADS CATEGORY  1: Negative. Electronically Signed   By: Lillia Mountain M.D.   On: 06/04/2015 10:21       Assessment & Plan:   Problem List Items Addressed This Visit    Health care maintenance    Physical today 04/23/16.  PAP 04/23/15 negative with negative HPV.  Just had colonoscopy 07/2015.  Need results.        Iron deficiency    On iron.  Recheck cbc and ferritin.        Relevant Orders   CBC with Differential/Platelet (Completed)    Ferritin (Completed)   Tinnitus    Persistent.  Refer to ENT for evaluation.        Relevant Orders   Ambulatory referral to ENT    Other Visit Diagnoses    Breast cancer screening    -  Primary   Relevant Orders   MM Digital Screening       Einar Pheasant, MD

## 2016-04-23 NOTE — Progress Notes (Signed)
Pre-visit discussion using our clinic review tool. No additional management support is needed unless otherwise documented below in the visit note.  

## 2016-04-25 ENCOUNTER — Encounter: Payer: Self-pay | Admitting: Internal Medicine

## 2016-04-28 ENCOUNTER — Encounter: Payer: Self-pay | Admitting: Internal Medicine

## 2016-04-28 NOTE — Assessment & Plan Note (Signed)
On iron.  Recheck cbc and ferritin.

## 2016-04-28 NOTE — Assessment & Plan Note (Signed)
Persistent.  Refer to ENT for evaluation.   

## 2016-04-28 NOTE — Assessment & Plan Note (Signed)
Physical today 04/23/16.  PAP 04/23/15 negative with negative HPV.  Just had colonoscopy 07/2015.  Need results.

## 2016-06-05 ENCOUNTER — Ambulatory Visit
Admission: RE | Admit: 2016-06-05 | Discharge: 2016-06-05 | Disposition: A | Discharge status: Home / Self Care | Payer: Self-pay | Source: Ambulatory Visit | Attending: Internal Medicine | Admitting: Internal Medicine

## 2016-06-05 DIAGNOSIS — Z1239 Encounter for other screening for malignant neoplasm of breast: Secondary | ICD-10-CM

## 2016-06-05 DIAGNOSIS — Z1231 Encounter for screening mammogram for malignant neoplasm of breast: Secondary | ICD-10-CM | POA: Insufficient documentation

## 2016-10-22 ENCOUNTER — Telehealth: Payer: Self-pay | Admitting: *Deleted

## 2016-10-22 DIAGNOSIS — E611 Iron deficiency: Secondary | ICD-10-CM

## 2016-10-22 NOTE — Telephone Encounter (Signed)
Order placed for labs.

## 2016-10-22 NOTE — Telephone Encounter (Signed)
Pt coming in for labs tomorrow (8/23). Please place future lab orders.  Thanks  

## 2016-10-23 ENCOUNTER — Other Ambulatory Visit (INDEPENDENT_AMBULATORY_CARE_PROVIDER_SITE_OTHER): Payer: Self-pay

## 2016-10-23 DIAGNOSIS — E611 Iron deficiency: Secondary | ICD-10-CM

## 2016-10-23 LAB — FERRITIN: Ferritin: 23.7 ng/mL (ref 10.0–291.0)

## 2016-10-23 LAB — CBC WITH DIFFERENTIAL/PLATELET
BASOS PCT: 0.5 % (ref 0.0–3.0)
Basophils Absolute: 0 10*3/uL (ref 0.0–0.1)
EOS PCT: 4.5 % (ref 0.0–5.0)
Eosinophils Absolute: 0.2 10*3/uL (ref 0.0–0.7)
HCT: 41.4 % (ref 36.0–46.0)
HEMOGLOBIN: 13.8 g/dL (ref 12.0–15.0)
Lymphocytes Relative: 26.1 % (ref 12.0–46.0)
Lymphs Abs: 1.4 10*3/uL (ref 0.7–4.0)
MCHC: 33.3 g/dL (ref 30.0–36.0)
MCV: 87.7 fl (ref 78.0–100.0)
MONOS PCT: 7.6 % (ref 3.0–12.0)
Monocytes Absolute: 0.4 10*3/uL (ref 0.1–1.0)
Neutro Abs: 3.2 10*3/uL (ref 1.4–7.7)
Neutrophils Relative %: 61.3 % (ref 43.0–77.0)
Platelets: 270 10*3/uL (ref 150.0–400.0)
RBC: 4.72 Mil/uL (ref 3.87–5.11)
RDW: 12.6 % (ref 11.5–15.5)
WBC: 5.3 10*3/uL (ref 4.0–10.5)

## 2016-10-24 ENCOUNTER — Telehealth: Payer: Self-pay | Admitting: Internal Medicine

## 2016-10-24 ENCOUNTER — Other Ambulatory Visit: Payer: Self-pay | Admitting: Internal Medicine

## 2016-10-24 DIAGNOSIS — E611 Iron deficiency: Secondary | ICD-10-CM

## 2016-10-24 NOTE — Progress Notes (Signed)
Order placed for f/u lab.   

## 2016-10-24 NOTE — Telephone Encounter (Signed)
See lab results for further documentation.  

## 2016-10-24 NOTE — Telephone Encounter (Signed)
Pt called back returning your call. Please advise, thank you!  Call pt @ [phone]

## 2016-12-03 ENCOUNTER — Encounter: Payer: Self-pay | Admitting: Internal Medicine

## 2016-12-03 ENCOUNTER — Telehealth: Payer: Self-pay | Admitting: Internal Medicine

## 2017-01-19 ENCOUNTER — Other Ambulatory Visit (INDEPENDENT_AMBULATORY_CARE_PROVIDER_SITE_OTHER): Payer: Self-pay

## 2017-01-19 DIAGNOSIS — E611 Iron deficiency: Secondary | ICD-10-CM

## 2017-01-19 LAB — FERRITIN: Ferritin: 27.2 ng/mL (ref 10.0–291.0)

## 2017-01-19 LAB — CBC WITH DIFFERENTIAL/PLATELET
BASOS ABS: 0 10*3/uL (ref 0.0–0.1)
BASOS PCT: 0.7 % (ref 0.0–3.0)
EOS ABS: 0.3 10*3/uL (ref 0.0–0.7)
Eosinophils Relative: 5.1 % — ABNORMAL HIGH (ref 0.0–5.0)
HEMATOCRIT: 40.8 % (ref 36.0–46.0)
HEMOGLOBIN: 13.8 g/dL (ref 12.0–15.0)
LYMPHS PCT: 24.2 % (ref 12.0–46.0)
Lymphs Abs: 1.6 10*3/uL (ref 0.7–4.0)
MCHC: 33.9 g/dL (ref 30.0–36.0)
MCV: 87.1 fl (ref 78.0–100.0)
MONOS PCT: 7.4 % (ref 3.0–12.0)
Monocytes Absolute: 0.5 10*3/uL (ref 0.1–1.0)
Neutro Abs: 4 10*3/uL (ref 1.4–7.7)
Neutrophils Relative %: 62.6 % (ref 43.0–77.0)
Platelets: 272 10*3/uL (ref 150.0–400.0)
RBC: 4.68 Mil/uL (ref 3.87–5.11)
RDW: 12.2 % (ref 11.5–15.5)
WBC: 6.4 10*3/uL (ref 4.0–10.5)

## 2017-01-20 ENCOUNTER — Encounter: Payer: Self-pay | Admitting: Internal Medicine

## 2017-04-27 ENCOUNTER — Encounter: Payer: Self-pay | Admitting: Internal Medicine

## 2017-04-28 ENCOUNTER — Ambulatory Visit (INDEPENDENT_AMBULATORY_CARE_PROVIDER_SITE_OTHER): Payer: Self-pay | Admitting: Internal Medicine

## 2017-04-28 ENCOUNTER — Encounter: Payer: Self-pay | Admitting: Internal Medicine

## 2017-04-28 VITALS — BP 116/72 | HR 60 | Temp 98.7°F | Resp 16 | Wt 130.4 lb

## 2017-04-28 DIAGNOSIS — Z1231 Encounter for screening mammogram for malignant neoplasm of breast: Secondary | ICD-10-CM

## 2017-04-28 DIAGNOSIS — R87619 Unspecified abnormal cytological findings in specimens from cervix uteri: Secondary | ICD-10-CM

## 2017-04-28 DIAGNOSIS — E611 Iron deficiency: Secondary | ICD-10-CM

## 2017-04-28 DIAGNOSIS — Z0000 Encounter for general adult medical examination without abnormal findings: Secondary | ICD-10-CM

## 2017-04-28 DIAGNOSIS — Z1239 Encounter for other screening for malignant neoplasm of breast: Secondary | ICD-10-CM

## 2017-04-28 DIAGNOSIS — Z13220 Encounter for screening for lipoid disorders: Secondary | ICD-10-CM

## 2017-04-28 NOTE — Assessment & Plan Note (Signed)
History of ASCUS.  Last pap 04/07/12 - negative with negative HPV.  PAP 04/23/15 - negative with negative HPV.  Discussed f/u pap.  Will plan for next year.

## 2017-04-28 NOTE — Progress Notes (Signed)
Patient ID: Bridget Gordon, female   DOB: December 25, 1960, 57 y.o.   MRN: [id number]   Subjective:    Patient ID: Bridget Gordon, female    DOB: 1960-11-10, 57 y.o.   MRN: [id number]  HPI  Patient here for her physical exam.  States she is doing well.  Some increased stress with her daughter-n-law's health issues.  Overall handling things relatively well.  Stays active.  No chest pain.  No sob.  No acid reflux.  No abdominal pain.  Bowels moving.  No urine or vaginal problems.  Just had saliva testing.  Planning to start low dose estrogen/progesterone cream.  Discussed risk and possible side effects of estrogen.  She understands and wishes to proceed.  Overall feels good.  Off iron.     Past Medical History:  Diagnosis Date  . Cancer ([place])    skin  . Seasonal allergies    History reviewed. No pertinent surgical history. Family History  Problem Relation Age of Onset  . Heart disease Father        s/p CABG  . Skin cancer Father   . Skin cancer Mother   . Diabetes Maternal Grandmother   . Bone cancer Maternal Grandmother   . Liver cancer Maternal Grandfather   . Breast cancer Neg Hx   . Colon cancer Neg Hx    Social History   Socioeconomic History  . Marital status: Married    Spouse name: None  . Number of children: 7  . Years of education: None  . Highest education level: None  Social Needs  . Financial resource strain: None  . Food insecurity - worry: None  . Food insecurity - inability: None  . Transportation needs - medical: None  . Transportation needs - non-medical: None  Occupational History  . None  Tobacco Use  . Smoking status: Never Smoker  . Smokeless tobacco: Never Used  Substance and Sexual Activity  . Alcohol use: No    Alcohol/week: 0.0 oz  . Drug use: Yes    Types: Fentanyl  . Sexual activity: None  Other Topics Concern  . None  Social History Narrative  . None    Outpatient Encounter Medications as of 04/28/2017  Medication Sig  . calcium carbonate  200 MG capsule Take 250 mg by mouth daily.  . Multiple Vitamin (MULTIVITAMIN) tablet Take 1 tablet by mouth daily.  . Omega-3 Fatty Acids (FISH OIL PO) Take by mouth.  Marland Kitchen VITAMIN D, ERGOCALCIFEROL, PO Take by mouth daily.  . ferrous sulfate 325 (65 FE) MG EC tablet Take 325 mg by mouth 3 (three) times daily with meals.   No facility-administered encounter medications on file as of 04/28/2017.     Review of Systems  Constitutional: Negative for appetite change and unexpected weight change.  HENT: Negative for congestion and sinus pressure.   Eyes: Negative for pain and visual disturbance.  Respiratory: Negative for cough, chest tightness and shortness of breath.   Cardiovascular: Negative for chest pain, palpitations and leg swelling.  Gastrointestinal: Negative for abdominal pain, diarrhea and nausea.  Genitourinary: Negative for difficulty urinating and dysuria.  Musculoskeletal: Negative for joint swelling and myalgias.  Skin: Negative for color change and rash.  Neurological: Negative for dizziness, light-headedness and headaches.  Hematological: Negative for adenopathy. Does not bruise/bleed easily.  Psychiatric/Behavioral: Negative for agitation and dysphoric mood.       Objective:    Physical Exam  Constitutional: She is oriented to person, place, and time. She appears  well-developed and well-nourished. No distress.  HENT:  Nose: Nose normal.  Mouth/Throat: Oropharynx is clear and moist.  Eyes: Right eye exhibits no discharge. Left eye exhibits no discharge. No scleral icterus.  Neck: Neck supple. No thyromegaly present.  Cardiovascular: Normal rate and regular rhythm.  Pulmonary/Chest: Breath sounds normal. No accessory muscle usage. No tachypnea. No respiratory distress. She has no decreased breath sounds. She has no wheezes. She has no rhonchi. Right breast exhibits no inverted nipple, no mass, no nipple discharge and no tenderness (no axillary adenopathy). Left breast  exhibits no inverted nipple, no mass, no nipple discharge and no tenderness (no axilarry adenopathy).  Abdominal: Soft. Bowel sounds are normal. There is no tenderness.  Genitourinary:  Genitourinary Comments: Not performed.   Musculoskeletal: She exhibits no edema or tenderness.  Lymphadenopathy:    She has no cervical adenopathy.  Neurological: She is alert and oriented to person, place, and time.  Skin: Skin is warm. No rash noted. No erythema.  Psychiatric: She has a normal mood and affect. Her behavior is normal.    BP 116/72 (BP Location: Left Arm, Patient Position: Sitting, Cuff Size: Normal)   Pulse 60   Temp 98.7 F (37.1 C) (Oral)   Resp 16   Wt 130 lb 6.4 oz (59.1 kg)   LMP 10/13/2012   SpO2 98%   BMI 23.10 kg/m  Wt Readings from Last 3 Encounters:  04/28/17 130 lb 6.4 oz (59.1 kg)  04/23/16 127 lb 3.2 oz (57.7 kg)  04/23/15 127 lb (57.6 kg)     Lab Results  Component Value Date   WBC 6.4 01/19/2017   HGB 13.8 01/19/2017   HCT 40.8 01/19/2017   PLT 272.0 01/19/2017   GLUCOSE 81 05/11/2015   CHOL 195 05/11/2015   TRIG 39.0 05/11/2015   HDL 66.00 05/11/2015   LDLCALC 122 (H) 05/11/2015   ALT 24 05/11/2015   AST 24 05/11/2015   NA 140 05/11/2015   K 4.1 05/11/2015   CL 103 05/11/2015   CREATININE 0.78 05/11/2015   BUN 13 05/11/2015   CO2 30 05/11/2015   TSH 0.80 05/11/2015    Mm Digital Screening  Result Date: 06/05/2016 CLINICAL DATA:  Screening. EXAM: DIGITAL SCREENING BILATERAL MAMMOGRAM WITH CAD COMPARISON:  Previous exam(s). ACR Breast Density Category c: The breast tissue is heterogeneously dense, which may obscure small masses. FINDINGS: There are no findings suspicious for malignancy. Images were processed with CAD. IMPRESSION: No mammographic evidence of malignancy. A result letter of this screening mammogram will be mailed directly to the patient. RECOMMENDATION: Screening mammogram in one year. (Code:SM-B-01Y) BI-RADS CATEGORY  1: Negative.  Electronically Signed   By: Pamelia Hoit M.D.   On: 06/05/2016 13:54       Assessment & Plan:   Problem List Items Addressed This Visit    Abnormal Pap smear of cervix    History of ASCUS.  Last pap 04/07/12 - negative with negative HPV.  PAP 04/23/15 - negative with negative HPV.  Discussed f/u pap.  Will plan for next year.        Health care maintenance    Physical today 04/28/17.  PAP 04/23/15 - negative with negative HPV.  Colonoscopy 07/2015.      Iron deficiency    Not on iron now.  Recheck cbc and ferritin.        Relevant Orders   CBC with Differential/Platelet   Comprehensive metabolic panel   TSH   Ferritin    Other Visit  Diagnoses    Routine general medical examination at a health care facility    -  Primary   Screening for breast cancer       Relevant Orders   MM Digital Screening   Screening cholesterol level       Relevant Orders   Lipid panel       Einar Pheasant, MD

## 2017-04-28 NOTE — Assessment & Plan Note (Signed)
Physical today 04/28/17.  PAP 04/23/15 - negative with negative HPV.  Colonoscopy 07/2015.

## 2017-04-28 NOTE — Assessment & Plan Note (Signed)
Not on iron now.  Recheck cbc and ferritin.

## 2017-04-29 ENCOUNTER — Encounter: Payer: Self-pay | Admitting: Internal Medicine

## 2017-04-30 ENCOUNTER — Other Ambulatory Visit (INDEPENDENT_AMBULATORY_CARE_PROVIDER_SITE_OTHER): Payer: Self-pay

## 2017-04-30 DIAGNOSIS — Z13220 Encounter for screening for lipoid disorders: Secondary | ICD-10-CM

## 2017-04-30 DIAGNOSIS — E611 Iron deficiency: Secondary | ICD-10-CM

## 2017-04-30 LAB — CBC WITH DIFFERENTIAL/PLATELET
BASOS ABS: 0 10*3/uL (ref 0.0–0.1)
Basophils Relative: 0.6 % (ref 0.0–3.0)
EOS ABS: 0.3 10*3/uL (ref 0.0–0.7)
Eosinophils Relative: 6.3 % — ABNORMAL HIGH (ref 0.0–5.0)
HCT: 40.5 % (ref 36.0–46.0)
HEMOGLOBIN: 13.8 g/dL (ref 12.0–15.0)
LYMPHS PCT: 26.5 % (ref 12.0–46.0)
Lymphs Abs: 1.1 10*3/uL (ref 0.7–4.0)
MCHC: 34.1 g/dL (ref 30.0–36.0)
MCV: 85.6 fl (ref 78.0–100.0)
MONO ABS: 0.5 10*3/uL (ref 0.1–1.0)
Monocytes Relative: 11.1 % (ref 3.0–12.0)
Neutro Abs: 2.4 10*3/uL (ref 1.4–7.7)
Neutrophils Relative %: 55.5 % (ref 43.0–77.0)
Platelets: 256 10*3/uL (ref 150.0–400.0)
RBC: 4.73 Mil/uL (ref 3.87–5.11)
RDW: 12.4 % (ref 11.5–15.5)
WBC: 4.3 10*3/uL (ref 4.0–10.5)

## 2017-04-30 LAB — COMPREHENSIVE METABOLIC PANEL
ALBUMIN: 3.9 g/dL (ref 3.5–5.2)
ALK PHOS: 72 U/L (ref 39–117)
ALT: 19 U/L (ref 0–35)
AST: 19 U/L (ref 0–37)
BUN: 14 mg/dL (ref 6–23)
CALCIUM: 9.3 mg/dL (ref 8.4–10.5)
CO2: 29 mEq/L (ref 19–32)
CREATININE: 0.68 mg/dL (ref 0.40–1.20)
Chloride: 104 mEq/L (ref 96–112)
GFR: 94.59 mL/min (ref >60.00)
Glucose, Bld: 78 mg/dL (ref 70–99)
Potassium: 3.9 mEq/L (ref 3.5–5.1)
SODIUM: 140 meq/L (ref 135–145)
Total Bilirubin: 0.5 mg/dL (ref 0.2–1.2)
Total Protein: 7 g/dL (ref 6.0–8.3)

## 2017-04-30 LAB — LIPID PANEL
CHOLESTEROL: 209 mg/dL — AB (ref 0–200)
HDL: 65.6 mg/dL (ref >39.00)
LDL Cholesterol: 135 mg/dL — ABNORMAL HIGH (ref 0–99)
NonHDL: 143.19
TRIGLYCERIDES: 39 mg/dL (ref 0.0–149.0)
Total CHOL/HDL Ratio: 3
VLDL: 7.8 mg/dL (ref 0.0–40.0)

## 2017-04-30 LAB — FERRITIN: Ferritin: 35.8 ng/mL (ref 10.0–291.0)

## 2017-04-30 LAB — TSH: TSH: 1.26 u[IU]/mL (ref 0.35–4.50)

## 2017-05-01 ENCOUNTER — Encounter: Payer: Self-pay | Admitting: Internal Medicine

## 2017-06-08 ENCOUNTER — Ambulatory Visit
Admission: RE | Admit: 2017-06-08 | Discharge: 2017-06-08 | Disposition: A | Discharge status: Home / Self Care | Payer: Self-pay | Source: Ambulatory Visit | Attending: Internal Medicine | Admitting: Internal Medicine

## 2017-06-08 DIAGNOSIS — Z1231 Encounter for screening mammogram for malignant neoplasm of breast: Secondary | ICD-10-CM | POA: Insufficient documentation

## 2017-06-08 DIAGNOSIS — Z1239 Encounter for other screening for malignant neoplasm of breast: Secondary | ICD-10-CM

## 2018-01-13 ENCOUNTER — Encounter: Payer: Self-pay | Admitting: Internal Medicine

## 2018-04-30 ENCOUNTER — Ambulatory Visit (INDEPENDENT_AMBULATORY_CARE_PROVIDER_SITE_OTHER): Payer: Self-pay | Admitting: Internal Medicine

## 2018-04-30 ENCOUNTER — Other Ambulatory Visit (HOSPITAL_COMMUNITY)
Admission: RE | Admit: 2018-04-30 | Discharge: 2018-04-30 | Disposition: A | Discharge status: Home / Self Care | Payer: Self-pay | Source: Ambulatory Visit | Attending: Internal Medicine | Admitting: Internal Medicine

## 2018-04-30 ENCOUNTER — Encounter: Payer: Self-pay | Admitting: Internal Medicine

## 2018-04-30 VITALS — BP 118/60 | HR 64 | Temp 98.0°F | Resp 16 | Ht 63.0 in | Wt 125.4 lb

## 2018-04-30 DIAGNOSIS — Z0000 Encounter for general adult medical examination without abnormal findings: Secondary | ICD-10-CM

## 2018-04-30 DIAGNOSIS — Z124 Encounter for screening for malignant neoplasm of cervix: Secondary | ICD-10-CM | POA: Insufficient documentation

## 2018-04-30 DIAGNOSIS — R87619 Unspecified abnormal cytological findings in specimens from cervix uteri: Secondary | ICD-10-CM

## 2018-04-30 NOTE — Progress Notes (Signed)
Patient ID: Bridget Gordon, female   DOB: 1960-08-07, 58 y.o.   MRN: [id number]   Subjective:    Patient ID: Bridget Gordon, female    DOB: [birth year], 59 y.o.   MRN: [id number]  HPI  Patient here for her physical exam.  She reports she is doing well.  Exercising.  Staying active.  No chest pain.  No sob. No acid reflux.  No abdominal pain.  Bowels moving. No urine change.  Feels good.     Past Medical History:  Diagnosis Date  . Cancer ([place])    skin  . Seasonal allergies    History reviewed. No pertinent surgical history. Family History  Problem Relation Age of Onset  . Heart disease Father        s/p CABG  . Skin cancer Father   . Skin cancer Mother   . Diabetes Maternal Grandmother   . Bone cancer Maternal Grandmother   . Liver cancer Maternal Grandfather   . Breast cancer Neg Hx   . Colon cancer Neg Hx    Social History   Socioeconomic History  . Marital status: Married    Spouse name: Not on file  . Number of children: 7  . Years of education: Not on file  . Highest education level: Not on file  Occupational History  . Not on file  Social Needs  . Financial resource strain: Not on file  . Food insecurity:    Worry: Not on file    Inability: Not on file  . Transportation needs:    Medical: Not on file    Non-medical: Not on file  Tobacco Use  . Smoking status: Never Smoker  . Smokeless tobacco: Never Used  Substance and Sexual Activity  . Alcohol use: No    Alcohol/week: 0.0 standard drinks  . Drug use: Yes    Types: Fentanyl  . Sexual activity: Not on file  Lifestyle  . Physical activity:    Days per week: Not on file    Minutes per session: Not on file  . Stress: Not on file  Relationships  . Social connections:    Talks on phone: Not on file    Gets together: Not on file    Attends religious service: Not on file    Active member of club or organization: Not on file    Attends meetings of clubs or organizations: Not on file    Relationship status:  Not on file  Other Topics Concern  . Not on file  Social History Narrative  . Not on file    Outpatient Encounter Medications as of 04/30/2018  Medication Sig  . calcium carbonate 200 MG capsule Take 250 mg by mouth daily.  . Multiple Vitamin (MULTIVITAMIN) tablet Take 1 tablet by mouth daily.  . Omega-3 Fatty Acids (FISH OIL PO) Take by mouth.  Marland Kitchen VITAMIN D, ERGOCALCIFEROL, PO Take by mouth daily.  . [DISCONTINUED] ferrous sulfate 325 (65 FE) MG EC tablet Take 325 mg by mouth 3 (three) times daily with meals.   No facility-administered encounter medications on file as of 04/30/2018.     Review of Systems  Constitutional: Negative for appetite change and unexpected weight change.  HENT: Negative for congestion and sinus pressure.   Eyes: Negative for pain and visual disturbance.  Respiratory: Negative for cough, chest tightness and shortness of breath.   Cardiovascular: Negative for chest pain, palpitations and leg swelling.  Gastrointestinal: Negative for abdominal pain, diarrhea, nausea and vomiting.  Genitourinary: Negative for difficulty urinating and dysuria.  Musculoskeletal: Negative for joint swelling and myalgias.  Skin: Negative for color change and rash.  Neurological: Negative for dizziness, light-headedness and headaches.  Hematological: Negative for adenopathy. Does not bruise/bleed easily.  Psychiatric/Behavioral: Negative for agitation and dysphoric mood.       Objective:    Physical Exam Constitutional:      General: She is not in acute distress.    Appearance: Normal appearance. She is well-developed.  HENT:     Nose: Nose normal. No congestion.     Mouth/Throat:     Pharynx: No oropharyngeal exudate or posterior oropharyngeal erythema.  Eyes:     General: No scleral icterus.       Right eye: No discharge.        Left eye: No discharge.  Neck:     Musculoskeletal: Neck supple. No muscular tenderness.     Thyroid: No thyromegaly.  Cardiovascular:      Rate and Rhythm: Normal rate and regular rhythm.  Pulmonary:     Effort: No tachypnea, accessory muscle usage or respiratory distress.     Breath sounds: Normal breath sounds. No decreased breath sounds or wheezing.  Chest:     Breasts:        Right: No inverted nipple, mass, nipple discharge or tenderness (no axillary adenopathy).        Left: No inverted nipple, mass, nipple discharge or tenderness (no axilarry adenopathy).  Abdominal:     General: Bowel sounds are normal.     Palpations: Abdomen is soft.     Tenderness: There is no abdominal tenderness.  Musculoskeletal:        General: No swelling or tenderness.  Lymphadenopathy:     Cervical: No cervical adenopathy.  Skin:    Findings: No erythema or rash.  Neurological:     Mental Status: She is alert and oriented to person, place, and time.  Psychiatric:        Mood and Affect: Mood normal.        Behavior: Behavior normal.     BP 118/60   Pulse 64   Temp 98 F (36.7 C) (Oral)   Resp 16   Ht 5\' 3"  (1.6 m)   Wt 125 lb 6.4 oz (56.9 kg)   LMP 10/13/2012   SpO2 98%   BMI 22.21 kg/m  Wt Readings from Last 3 Encounters:  04/30/18 125 lb 6.4 oz (56.9 kg)  04/28/17 130 lb 6.4 oz (59.1 kg)  04/23/16 127 lb 3.2 oz (57.7 kg)     Lab Results  Component Value Date   WBC 4.3 04/30/2017   HGB 13.8 04/30/2017   HCT 40.5 04/30/2017   PLT 256.0 04/30/2017   GLUCOSE 78 04/30/2017   CHOL 209 (H) 04/30/2017   TRIG 39.0 04/30/2017   HDL 65.60 04/30/2017   LDLCALC 135 (H) 04/30/2017   ALT 19 04/30/2017   AST 19 04/30/2017   NA 140 04/30/2017   K 3.9 04/30/2017   CL 104 04/30/2017   CREATININE 0.68 04/30/2017   BUN 14 04/30/2017   CO2 29 04/30/2017   TSH 1.26 04/30/2017    Mm Digital Screening  Result Date: 06/09/2017 CLINICAL DATA:  Screening. EXAM: DIGITAL SCREENING BILATERAL MAMMOGRAM WITH CAD COMPARISON:  Previous exam(s). ACR Breast Density Category c: The breast tissue is heterogeneously dense, which may  obscure small masses. FINDINGS: There are no findings suspicious for malignancy. Images were processed with CAD. IMPRESSION: No mammographic evidence of malignancy.  A result letter of this screening mammogram will be mailed directly to the patient. RECOMMENDATION: Screening mammogram in one year. (Code:SM-B-01Y) BI-RADS CATEGORY  1: Negative. Electronically Signed   By: Nolon Nations M.D.   On: 06/09/2017 08:20       Assessment & Plan:   Problem List Items Addressed This Visit    Abnormal Pap smear of cervix    History of ASCUS.  Last pap 04/2015 - negative with negative HPV.  Repeat PAP 04/30/18.        Health care maintenance    Physical today 04/30/18.  PAP 04/30/18.  Colonoscopy 07/2015.  Mammogram 06/09/17 - Birads I.  Schedule f/u mammogram.  She wants to schedule.        Other Visit Diagnoses    Cervical cancer screening    -  Primary   Relevant Orders   Cytology - PAP( [facility])       Einar Pheasant, MD

## 2018-04-30 NOTE — Assessment & Plan Note (Addendum)
Physical today 04/30/18.  PAP 04/30/18.  Colonoscopy 07/2015.  Mammogram 06/09/17 - Birads I.  Schedule f/u mammogram.  She wants to schedule.

## 2018-05-02 ENCOUNTER — Encounter: Payer: Self-pay | Admitting: Internal Medicine

## 2018-05-02 NOTE — Assessment & Plan Note (Signed)
History of ASCUS.  Last pap 04/2015 - negative with negative HPV.  Repeat PAP 04/30/18.

## 2018-05-03 ENCOUNTER — Encounter: Payer: Self-pay | Admitting: Internal Medicine

## 2018-05-03 ENCOUNTER — Other Ambulatory Visit: Payer: Self-pay | Admitting: Internal Medicine

## 2018-05-03 DIAGNOSIS — Z1231 Encounter for screening mammogram for malignant neoplasm of breast: Secondary | ICD-10-CM

## 2018-05-03 LAB — CYTOLOGY - PAP
Diagnosis: NEGATIVE
HPV: NOT DETECTED

## 2018-07-29 ENCOUNTER — Encounter: Payer: Self-pay | Admitting: Internal Medicine

## 2018-08-30 ENCOUNTER — Ambulatory Visit
Admission: RE | Admit: 2018-08-30 | Discharge: 2018-08-30 | Disposition: A | Discharge status: Home / Self Care | Payer: Self-pay | Source: Ambulatory Visit | Attending: Internal Medicine | Admitting: Internal Medicine

## 2018-08-30 ENCOUNTER — Other Ambulatory Visit: Payer: Self-pay

## 2018-08-30 DIAGNOSIS — Z1231 Encounter for screening mammogram for malignant neoplasm of breast: Secondary | ICD-10-CM | POA: Insufficient documentation

## 2018-09-30 ENCOUNTER — Encounter: Payer: Self-pay | Admitting: Internal Medicine

## 2018-12-21 NOTE — Telephone Encounter (Signed)
error 

## 2019-01-17 ENCOUNTER — Encounter: Payer: Self-pay | Admitting: Internal Medicine

## 2019-01-17 NOTE — Telephone Encounter (Signed)
Signed and placed in box.   

## 2019-01-20 NOTE — Telephone Encounter (Signed)
° °  Pharmacy call and they did not rec the fax for pt cream and said she can take a verbal    Louie Casa with [employer]

## 2019-05-02 ENCOUNTER — Other Ambulatory Visit: Payer: Self-pay

## 2019-05-05 ENCOUNTER — Other Ambulatory Visit: Payer: Self-pay

## 2019-05-05 ENCOUNTER — Ambulatory Visit (INDEPENDENT_AMBULATORY_CARE_PROVIDER_SITE_OTHER): Payer: Self-pay | Admitting: Internal Medicine

## 2019-05-05 ENCOUNTER — Encounter: Payer: Self-pay | Admitting: Internal Medicine

## 2019-05-05 VITALS — BP 116/78 | HR 78 | Temp 97.8°F | Resp 16 | Ht 63.0 in | Wt 129.4 lb

## 2019-05-05 DIAGNOSIS — L299 Pruritus, unspecified: Secondary | ICD-10-CM

## 2019-05-05 DIAGNOSIS — Z0000 Encounter for general adult medical examination without abnormal findings: Secondary | ICD-10-CM

## 2019-05-05 DIAGNOSIS — F439 Reaction to severe stress, unspecified: Secondary | ICD-10-CM

## 2019-05-05 DIAGNOSIS — Z13220 Encounter for screening for lipoid disorders: Secondary | ICD-10-CM

## 2019-05-05 NOTE — Assessment & Plan Note (Addendum)
Mammogram 08/30/18 - birads I.  Physical today 05/05/19.  PAP 04/30/18 - negative with negative HPV.  Colonoscopy 07/2015.

## 2019-05-05 NOTE — Progress Notes (Signed)
Patient ID: Bridget Gordon, female   DOB: [birth year], 59 y.o.   MRN: [id number]   Subjective:    Patient ID: Bridget Gordon, female    DOB: [birth year], 59 y.o.   MRN: [id number]  HPI This visit occurred during the SARS-CoV-2 public health emergency.  Safety protocols were in place, including screening questions prior to the visit, additional usage of staff PPE, and extensive cleaning of exam room while observing appropriate contact time as indicated for disinfecting solutions.  Patient here for her physical exam.  She reports she is doing relatively well.  Increased stress recently with some family issues.  Also increased stress related to covid restrictions.  Overall she feels she is handling things relatively well.  Tries to stay active.  No chest pain or sob reported.  No acid reflux or abdominal pain reported.  Does report some right nipple - itching. No rash.  No new bra - contacts.  No nipple discharge.  Overall she feels things are relatively stable.     Past Medical History:  Diagnosis Date  . Cancer ([place])    skin  . Seasonal allergies    History reviewed. No pertinent surgical history. Family History  Problem Relation Age of Onset  . Heart disease Father        s/p CABG  . Skin cancer Father   . Skin cancer Mother   . Diabetes Maternal Grandmother   . Bone cancer Maternal Grandmother   . Liver cancer Maternal Grandfather   . Breast cancer Neg Hx   . Colon cancer Neg Hx    Social History   Socioeconomic History  . Marital status: Married    Spouse name: Not on file  . Number of children: 7  . Years of education: Not on file  . Highest education level: Not on file  Occupational History  . Not on file  Tobacco Use  . Smoking status: Never Smoker  . Smokeless tobacco: Never Used  Substance and Sexual Activity  . Alcohol use: No    Alcohol/week: 0.0 standard drinks  . Drug use: Yes    Types: Fentanyl  . Sexual activity: Not on file  Other Topics Concern  . Not on  file  Social History Narrative  . Not on file   Social Determinants of Health   Financial Resource Strain:   . Difficulty of Paying Living Expenses:   Food Insecurity:   . Worried About Charity fundraiser in the Last Year:   . Arboriculturist in the Last Year:   Transportation Needs:   . Film/video editor (Medical):   Marland Kitchen Lack of Transportation (Non-Medical):   Physical Activity:   . Days of Exercise per Week:   . Minutes of Exercise per Session:   Stress:   . Feeling of Stress :   Social Connections:   . Frequency of Communication with Friends and Family:   . Frequency of Social Gatherings with Friends and Family:   . Attends Religious Services:   . Active Member of Clubs or Organizations:   . Attends Archivist Meetings:   Marland Kitchen Marital Status:     Outpatient Encounter Medications as of 05/05/2019  Medication Sig  . calcium carbonate 200 MG capsule Take 250 mg by mouth daily.  . Multiple Vitamin (MULTIVITAMIN) tablet Take 1 tablet by mouth daily.  . Omega-3 Fatty Acids (FISH OIL PO) Take by mouth.  Marland Kitchen VITAMIN D, ERGOCALCIFEROL, PO Take by mouth daily.  No facility-administered encounter medications on file as of 05/05/2019.    Review of Systems  Constitutional: Negative for appetite change and unexpected weight change.  HENT: Negative for congestion and sinus pressure.   Eyes: Negative for pain and visual disturbance.  Respiratory: Negative for cough, chest tightness and shortness of breath.   Cardiovascular: Negative for chest pain, palpitations and leg swelling.  Gastrointestinal: Negative for abdominal pain, diarrhea, nausea and vomiting.  Genitourinary: Negative for difficulty urinating and dysuria.  Musculoskeletal: Negative for joint swelling and myalgias.  Skin: Negative for color change and rash.  Neurological: Negative for dizziness, light-headedness and headaches.  Hematological: Negative for adenopathy. Does not bruise/bleed easily.   Psychiatric/Behavioral: Negative for agitation and dysphoric mood.       Increased stress as outlined.        Objective:    Physical Exam Constitutional:      General: She is not in acute distress.    Appearance: Normal appearance. She is well-developed.  HENT:     Head: Normocephalic and atraumatic.     Right Ear: External ear normal.     Left Ear: External ear normal.  Eyes:     General: No scleral icterus.       Right eye: No discharge.        Left eye: No discharge.     Conjunctiva/sclera: Conjunctivae normal.  Neck:     Thyroid: No thyromegaly.  Cardiovascular:     Rate and Rhythm: Normal rate and regular rhythm.  Pulmonary:     Effort: No tachypnea, accessory muscle usage or respiratory distress.     Breath sounds: Normal breath sounds. No decreased breath sounds or wheezing.  Chest:     Breasts:        Right: No inverted nipple, mass, nipple discharge or tenderness (no axillary adenopathy).        Left: No inverted nipple, mass, nipple discharge or tenderness (no axilarry adenopathy).  Abdominal:     General: Bowel sounds are normal.     Palpations: Abdomen is soft.     Tenderness: There is no abdominal tenderness.  Musculoskeletal:        General: No swelling or tenderness.     Cervical back: Neck supple. No tenderness.  Lymphadenopathy:     Cervical: No cervical adenopathy.  Skin:    Findings: No erythema or rash.  Neurological:     Mental Status: She is alert and oriented to person, place, and time.  Psychiatric:        Mood and Affect: Mood normal.        Behavior: Behavior normal.     BP 116/78   Pulse 78   Temp 97.8 F (36.6 C)   Resp 16   Ht 5\' 3"  (1.6 m)   Wt 129 lb 6.4 oz (58.7 kg)   LMP 10/13/2012   SpO2 99%   BMI 22.92 kg/m  Wt Readings from Last 3 Encounters:  05/05/19 129 lb 6.4 oz (58.7 kg)  04/30/18 125 lb 6.4 oz (56.9 kg)  04/28/17 130 lb 6.4 oz (59.1 kg)     Lab Results  Component Value Date   WBC 4.3 04/30/2017   HGB  13.8 04/30/2017   HCT 40.5 04/30/2017   PLT 256.0 04/30/2017   GLUCOSE 78 04/30/2017   CHOL 209 (H) 04/30/2017   TRIG 39.0 04/30/2017   HDL 65.60 04/30/2017   LDLCALC 135 (H) 04/30/2017   ALT 19 04/30/2017   AST 19 04/30/2017   NA  140 04/30/2017   K 3.9 04/30/2017   CL 104 04/30/2017   CREATININE 0.68 04/30/2017   BUN 14 04/30/2017   CO2 29 04/30/2017   TSH 1.26 04/30/2017    MM 3D SCREEN BREAST BILATERAL  Result Date: 08/30/2018 CLINICAL DATA:  Screening. EXAM: DIGITAL SCREENING BILATERAL MAMMOGRAM WITH TOMO AND CAD COMPARISON:  Previous exam(s). ACR Breast Density Category c: The breast tissue is heterogeneously dense, which may obscure small masses. FINDINGS: There are no findings suspicious for malignancy. Images were processed with CAD. IMPRESSION: No mammographic evidence of malignancy. A result letter of this screening mammogram will be mailed directly to the patient. RECOMMENDATION: Screening mammogram in one year. (Code:SM-B-01Y) BI-RADS CATEGORY  1: Negative. Electronically Signed   By: Marin Olp M.D.   On: 08/30/2018 17:36       Assessment & Plan:   Problem List Items Addressed This Visit    Health care maintenance    Mammogram 08/30/18 - birads I.  Physical today 05/05/19.  PAP 04/30/18 - negative with negative HPV.  Colonoscopy 07/2015.        Itching    Itching of nipple as outlined.  Mammogram 08/30/2018 - Birads I.  No clear etiology.  No rash.  No new contacts/exposure.  Discussed further w/up /evaluation.  Wants to monitor.  Notify if persistent.         Relevant Orders   CBC with Differential/Platelet   Comprehensive metabolic panel   Stress    Increased stress as outlined.  Overall appears to be handling things well.  Follow.         Other Visit Diagnoses    Routine general medical examination at a health care facility    -  Primary   Relevant Orders   TSH   Screening cholesterol level       Relevant Orders   Lipid panel       Einar Pheasant,  MD

## 2019-05-11 ENCOUNTER — Telehealth: Payer: Self-pay | Admitting: *Deleted

## 2019-05-11 NOTE — Telephone Encounter (Signed)
Please place future orders for lab appt.  

## 2019-05-12 ENCOUNTER — Other Ambulatory Visit (INDEPENDENT_AMBULATORY_CARE_PROVIDER_SITE_OTHER): Payer: Self-pay

## 2019-05-12 ENCOUNTER — Encounter: Payer: Self-pay | Admitting: Internal Medicine

## 2019-05-12 ENCOUNTER — Other Ambulatory Visit: Payer: Self-pay

## 2019-05-12 DIAGNOSIS — L299 Pruritus, unspecified: Secondary | ICD-10-CM | POA: Insufficient documentation

## 2019-05-12 DIAGNOSIS — Z13220 Encounter for screening for lipoid disorders: Secondary | ICD-10-CM

## 2019-05-12 DIAGNOSIS — F439 Reaction to severe stress, unspecified: Secondary | ICD-10-CM | POA: Insufficient documentation

## 2019-05-12 DIAGNOSIS — Z0000 Encounter for general adult medical examination without abnormal findings: Secondary | ICD-10-CM

## 2019-05-12 LAB — COMPREHENSIVE METABOLIC PANEL
ALT: 20 U/L (ref 0–35)
AST: 18 U/L (ref 0–37)
Albumin: 4.2 g/dL (ref 3.5–5.2)
Alkaline Phosphatase: 67 U/L (ref 39–117)
BUN: 10 mg/dL (ref 6–23)
CO2: 30 mEq/L (ref 19–32)
Calcium: 9.2 mg/dL (ref 8.4–10.5)
Chloride: 100 mEq/L (ref 96–112)
Creatinine, Ser: 0.71 mg/dL (ref 0.40–1.20)
GFR: 84.08 mL/min (ref >60.00)
Glucose, Bld: 83 mg/dL (ref 70–99)
Potassium: 4.3 mEq/L (ref 3.5–5.1)
Sodium: 137 mEq/L (ref 135–145)
Total Bilirubin: 0.6 mg/dL (ref 0.2–1.2)
Total Protein: 6.7 g/dL (ref 6.0–8.3)

## 2019-05-12 LAB — CBC WITH DIFFERENTIAL/PLATELET
Basophils Absolute: 0 10*3/uL (ref 0.0–0.1)
Basophils Relative: 0.6 % (ref 0.0–3.0)
Eosinophils Absolute: 0.4 10*3/uL (ref 0.0–0.7)
Eosinophils Relative: 7.4 % — ABNORMAL HIGH (ref 0.0–5.0)
HCT: 39.3 % (ref 36.0–46.0)
Hemoglobin: 13.3 g/dL (ref 12.0–15.0)
Lymphocytes Relative: 27.3 % (ref 12.0–46.0)
Lymphs Abs: 1.4 10*3/uL (ref 0.7–4.0)
MCHC: 33.9 g/dL (ref 30.0–36.0)
MCV: 86.8 fl (ref 78.0–100.0)
Monocytes Absolute: 0.4 10*3/uL (ref 0.1–1.0)
Monocytes Relative: 8 % (ref 3.0–12.0)
Neutro Abs: 2.8 10*3/uL (ref 1.4–7.7)
Neutrophils Relative %: 56.7 % (ref 43.0–77.0)
Platelets: 287 10*3/uL (ref 150.0–400.0)
RBC: 4.53 Mil/uL (ref 3.87–5.11)
RDW: 12.5 % (ref 11.5–15.5)
WBC: 4.9 10*3/uL (ref 4.0–10.5)

## 2019-05-12 LAB — LIPID PANEL
Cholesterol: 235 mg/dL — ABNORMAL HIGH (ref 0–200)
HDL: 70.3 mg/dL (ref >39.00)
LDL Cholesterol: 153 mg/dL — ABNORMAL HIGH (ref 0–99)
NonHDL: 164.91
Total CHOL/HDL Ratio: 3
Triglycerides: 58 mg/dL (ref 0.0–149.0)
VLDL: 11.6 mg/dL (ref 0.0–40.0)

## 2019-05-12 LAB — TSH: TSH: 1.04 u[IU]/mL (ref 0.35–4.50)

## 2019-05-12 NOTE — Assessment & Plan Note (Signed)
Itching of nipple as outlined.  Mammogram 08/30/2018 - Birads I.  No clear etiology.  No rash.  No new contacts/exposure.  Discussed further w/up /evaluation.  Wants to monitor.  Notify if persistent.

## 2019-05-12 NOTE — Assessment & Plan Note (Signed)
Increased stress as outlined.  Overall appears to be handling things well  Follow.  

## 2019-05-12 NOTE — Telephone Encounter (Signed)
Orders placed for labs

## 2019-05-16 ENCOUNTER — Encounter: Payer: Self-pay | Admitting: Internal Medicine

## 2019-05-24 ENCOUNTER — Other Ambulatory Visit: Payer: Self-pay

## 2019-05-31 ENCOUNTER — Encounter: Payer: Self-pay | Admitting: Internal Medicine

## 2019-06-14 ENCOUNTER — Encounter: Payer: Self-pay | Admitting: Internal Medicine

## 2019-06-20 ENCOUNTER — Encounter: Payer: Self-pay | Admitting: Internal Medicine

## 2019-07-05 ENCOUNTER — Encounter: Payer: Self-pay | Admitting: Internal Medicine

## 2019-07-05 ENCOUNTER — Other Ambulatory Visit: Payer: Self-pay

## 2019-07-05 NOTE — Telephone Encounter (Signed)
This is the pt we discussed.  Need rx to sign.  See her my chart message.

## 2019-07-05 NOTE — Telephone Encounter (Signed)
Left message for [facility] to return my call

## 2019-07-06 NOTE — Telephone Encounter (Signed)
Faxed script to [facility] with 5 refills.

## 2019-07-06 NOTE — Telephone Encounter (Signed)
Called patient and discussed. Awaiting fax from [facility]

## 2019-07-19 ENCOUNTER — Other Ambulatory Visit: Payer: Self-pay | Admitting: Internal Medicine

## 2019-07-19 DIAGNOSIS — Z1231 Encounter for screening mammogram for malignant neoplasm of breast: Secondary | ICD-10-CM

## 2019-08-31 ENCOUNTER — Ambulatory Visit
Admission: RE | Admit: 2019-08-31 | Discharge: 2019-08-31 | Disposition: A | Discharge status: Home / Self Care | Payer: Self-pay | Source: Ambulatory Visit | Attending: Internal Medicine | Admitting: Internal Medicine

## 2019-08-31 DIAGNOSIS — Z1231 Encounter for screening mammogram for malignant neoplasm of breast: Secondary | ICD-10-CM

## 2020-03-31 ENCOUNTER — Encounter: Payer: Self-pay | Admitting: *Deleted

## 2020-05-08 ENCOUNTER — Ambulatory Visit: Payer: Self-pay | Admitting: Internal Medicine

## 2020-05-08 ENCOUNTER — Other Ambulatory Visit: Payer: Self-pay

## 2020-05-08 ENCOUNTER — Encounter: Payer: Self-pay | Admitting: Internal Medicine

## 2020-05-08 VITALS — BP 118/70 | HR 54 | Temp 97.8°F | Resp 16 | Ht 63.0 in | Wt 130.0 lb

## 2020-05-08 DIAGNOSIS — F439 Reaction to severe stress, unspecified: Secondary | ICD-10-CM

## 2020-05-08 DIAGNOSIS — Z114 Encounter for screening for human immunodeficiency virus [HIV]: Secondary | ICD-10-CM

## 2020-05-08 DIAGNOSIS — Z8639 Personal history of other endocrine, nutritional and metabolic disease: Secondary | ICD-10-CM

## 2020-05-08 DIAGNOSIS — Z1159 Encounter for screening for other viral diseases: Secondary | ICD-10-CM

## 2020-05-08 DIAGNOSIS — Z13220 Encounter for screening for lipoid disorders: Secondary | ICD-10-CM

## 2020-05-08 DIAGNOSIS — Z0000 Encounter for general adult medical examination without abnormal findings: Secondary | ICD-10-CM

## 2020-05-08 LAB — CBC WITH DIFFERENTIAL/PLATELET
Basophils Absolute: 0 10*3/uL (ref 0.0–0.1)
Basophils Relative: 0.8 % (ref 0.0–3.0)
Eosinophils Absolute: 0.3 10*3/uL (ref 0.0–0.7)
Eosinophils Relative: 6.6 % — ABNORMAL HIGH (ref 0.0–5.0)
HCT: 39.4 % (ref 36.0–46.0)
Hemoglobin: 13.4 g/dL (ref 12.0–15.0)
Lymphocytes Relative: 26.2 % (ref 12.0–46.0)
Lymphs Abs: 1.4 10*3/uL (ref 0.7–4.0)
MCHC: 34 g/dL (ref 30.0–36.0)
MCV: 85.5 fl (ref 78.0–100.0)
Monocytes Absolute: 0.4 10*3/uL (ref 0.1–1.0)
Monocytes Relative: 8.3 % (ref 3.0–12.0)
Neutro Abs: 3 10*3/uL (ref 1.4–7.7)
Neutrophils Relative %: 58.1 % (ref 43.0–77.0)
Platelets: 280 10*3/uL (ref 150.0–400.0)
RBC: 4.61 Mil/uL (ref 3.87–5.11)
RDW: 13 % (ref 11.5–15.5)
WBC: 5.2 10*3/uL (ref 4.0–10.5)

## 2020-05-08 LAB — COMPREHENSIVE METABOLIC PANEL
ALT: 14 U/L (ref 0–35)
AST: 17 U/L (ref 0–37)
Albumin: 4.3 g/dL (ref 3.5–5.2)
Alkaline Phosphatase: 68 U/L (ref 39–117)
BUN: 11 mg/dL (ref 6–23)
CO2: 30 mEq/L (ref 19–32)
Calcium: 9.3 mg/dL (ref 8.4–10.5)
Chloride: 102 mEq/L (ref 96–112)
Creatinine, Ser: 0.73 mg/dL (ref 0.40–1.20)
GFR: 89.27 mL/min (ref >60.00)
Glucose, Bld: 82 mg/dL (ref 70–99)
Potassium: 4.3 mEq/L (ref 3.5–5.1)
Sodium: 138 mEq/L (ref 135–145)
Total Bilirubin: 0.7 mg/dL (ref 0.2–1.2)
Total Protein: 6.8 g/dL (ref 6.0–8.3)

## 2020-05-08 LAB — LIPID PANEL
Cholesterol: 228 mg/dL — ABNORMAL HIGH (ref 0–200)
HDL: 67.2 mg/dL (ref >39.00)
LDL Cholesterol: 150 mg/dL — ABNORMAL HIGH (ref 0–99)
NonHDL: 161.28
Total CHOL/HDL Ratio: 3
Triglycerides: 56 mg/dL (ref 0.0–149.0)
VLDL: 11.2 mg/dL (ref 0.0–40.0)

## 2020-05-08 LAB — TSH: TSH: 0.93 u[IU]/mL (ref 0.35–4.50)

## 2020-05-08 NOTE — Progress Notes (Signed)
Patient ID: Bridget Gordon, female   DOB: 1960-08-13, 60 y.o.   MRN: [id number]   Subjective:    Patient ID: Bridget Gordon, female    DOB: May 14, 1960, 60 y.o.   MRN: [id number]  HPI This visit occurred during the SARS-CoV-2 public health emergency.  Safety protocols were in place, including screening questions prior to the visit, additional usage of staff PPE, and extensive cleaning of exam room while observing appropriate contact time as indicated for disinfecting solutions.  Patient here for her physical exam.  She is doing well.  Stays active. No chest pain or sob with increased activity or exertion.  No acid reflux or abdominal pain reported.  No bowel problems or vaginal problems.  Had questions about covid booster.   Past Medical History:  Diagnosis Date  . Cancer ([place])    skin  . Seasonal allergies    History reviewed. No pertinent surgical history. Family History  Problem Relation Age of Onset  . Heart disease Father        s/p CABG  . Skin cancer Father   . Skin cancer Mother   . Diabetes Maternal Grandmother   . Bone cancer Maternal Grandmother   . Liver cancer Maternal Grandfather   . Breast cancer Neg Hx   . Colon cancer Neg Hx    Social History   Socioeconomic History  . Marital status: Married    Spouse name: Not on file  . Number of children: 7  . Years of education: Not on file  . Highest education level: Not on file  Occupational History  . Not on file  Tobacco Use  . Smoking status: Never Smoker  . Smokeless tobacco: Never Used  Substance and Sexual Activity  . Alcohol use: No    Alcohol/week: 0.0 standard drinks  . Drug use: Yes    Types: Fentanyl  . Sexual activity: Not on file  Other Topics Concern  . Not on file  Social History Narrative  . Not on file   Social Determinants of Health   Financial Resource Strain: Not on file  Food Insecurity: Not on file  Transportation Needs: Not on file  Physical Activity: Not on file  Stress: Not on file   Social Connections: Not on file    Outpatient Encounter Medications as of 05/08/2020  Medication Sig  . Multiple Vitamin (MULTIVITAMIN) tablet Take 1 tablet by mouth daily.  . Omega-3 Fatty Acids (FISH OIL PO) Take by mouth.  Marland Kitchen VITAMIN D, ERGOCALCIFEROL, PO Take by mouth daily.   No facility-administered encounter medications on file as of 05/08/2020.    Review of Systems  Constitutional: Negative for appetite change and unexpected weight change.  HENT: Negative for congestion, sinus pressure and sore throat.   Eyes: Negative for pain and visual disturbance.  Respiratory: Negative for cough, chest tightness and shortness of breath.   Cardiovascular: Negative for chest pain, palpitations and leg swelling.  Gastrointestinal: Negative for abdominal pain, diarrhea, nausea and vomiting.  Genitourinary: Negative for difficulty urinating and dysuria.  Musculoskeletal: Negative for joint swelling and myalgias.  Skin: Negative for color change and rash.  Neurological: Negative for dizziness, light-headedness and headaches.  Hematological: Negative for adenopathy. Does not bruise/bleed easily.  Psychiatric/Behavioral: Negative for agitation and dysphoric mood.       Objective:    Physical Exam Vitals reviewed.  Constitutional:      General: She is not in acute distress.    Appearance: Normal appearance. She is well-developed and well-nourished.  HENT:     Head: Normocephalic and atraumatic.     Right Ear: External ear normal.     Left Ear: External ear normal.     Mouth/Throat:     Mouth: Oropharynx is clear and moist.  Eyes:     General: No scleral icterus.       Right eye: No discharge.        Left eye: No discharge.     Conjunctiva/sclera: Conjunctivae normal.  Neck:     Thyroid: No thyromegaly.  Cardiovascular:     Rate and Rhythm: Normal rate and regular rhythm.  Pulmonary:     Effort: No tachypnea, accessory muscle usage or respiratory distress.     Breath sounds: Normal  breath sounds. No decreased breath sounds or wheezing.  Chest:  Breasts:     Right: No inverted nipple, mass, nipple discharge or tenderness (no axillary adenopathy).     Left: No inverted nipple, mass, nipple discharge or tenderness (no axilarry adenopathy).    Abdominal:     General: Bowel sounds are normal.     Palpations: Abdomen is soft.     Tenderness: There is no abdominal tenderness.  Musculoskeletal:        General: No swelling, tenderness or edema.     Cervical back: Neck supple. No tenderness.  Lymphadenopathy:     Cervical: No cervical adenopathy.  Skin:    Findings: No erythema or rash.  Neurological:     Mental Status: She is alert and oriented to person, place, and time.  Psychiatric:        Mood and Affect: Mood and affect and mood normal.        Behavior: Behavior normal.     BP 118/70   Pulse (!) 54   Temp 97.8 F (36.6 C) (Oral)   Resp 16   Ht 5\' 3"  (1.6 m)   Wt 130 lb (59 kg)   LMP 10/13/2012   SpO2 99%   BMI 23.03 kg/m  Wt Readings from Last 3 Encounters:  05/08/20 130 lb (59 kg)  05/05/19 129 lb 6.4 oz (58.7 kg)  04/30/18 125 lb 6.4 oz (56.9 kg)     Lab Results  Component Value Date   WBC 5.2 05/08/2020   HGB 13.4 05/08/2020   HCT 39.4 05/08/2020   PLT 280.0 05/08/2020   GLUCOSE 82 05/08/2020   CHOL 228 (H) 05/08/2020   TRIG 56.0 05/08/2020   HDL 67.20 05/08/2020   LDLCALC 150 (H) 05/08/2020   ALT 14 05/08/2020   AST 17 05/08/2020   NA 138 05/08/2020   K 4.3 05/08/2020   CL 102 05/08/2020   CREATININE 0.73 05/08/2020   BUN 11 05/08/2020   CO2 30 05/08/2020   TSH 0.93 05/08/2020    MM 3D SCREEN BREAST BILATERAL  Result Date: 09/01/2019 CLINICAL DATA:  Screening. EXAM: DIGITAL SCREENING BILATERAL MAMMOGRAM WITH TOMO AND CAD COMPARISON:  Previous exam(s). ACR Breast Density Category c: The breast tissue is heterogeneously dense, which may obscure small masses. FINDINGS: There are no findings suspicious for malignancy. Images were  processed with CAD. IMPRESSION: No mammographic evidence of malignancy. A result letter of this screening mammogram will be mailed directly to the patient. RECOMMENDATION: Screening mammogram in one year. (Code:SM-B-01Y) BI-RADS CATEGORY  1: Negative. Electronically Signed   By: Claudie Revering M.D.   On: 09/01/2019 16:15       Assessment & Plan:   Problem List Items Addressed This Visit    Health care  maintenance    Physical today 05/08/20.  Mammogram 09/01/19 - Birads I.  PAP 04/30/18 - negative with negative HPV.  Colonoscopy 07/2015.        History of iron deficiency    Recheck cbc today.       Relevant Orders   CBC with Differential/Platelet (Completed)   Comprehensive metabolic panel (Completed)   TSH (Completed)   Stress    Handling stress.  Appears to be doing well.  Follow.        Other Visit Diagnoses    Routine general medical examination at a health care facility    -  Primary   Screening cholesterol level       Relevant Orders   Lipid panel (Completed)   Need for hepatitis C screening test       Relevant Orders   Hepatitis C antibody (Completed)   Screening for HIV without presence of risk factors       Relevant Orders   HIV antibody (with reflex) (Completed)       Einar Pheasant, MD

## 2020-05-08 NOTE — Assessment & Plan Note (Signed)
Physical today 05/08/20.  Mammogram 09/01/19 - Birads I.  PAP 04/30/18 - negative with negative HPV.  Colonoscopy 07/2015.

## 2020-05-08 NOTE — Assessment & Plan Note (Signed)
Recheck cbc today.   

## 2020-05-09 LAB — HEPATITIS C ANTIBODY
Hepatitis C Ab: NONREACTIVE
SIGNAL TO CUT-OFF: 0 (ref <1.00)

## 2020-05-09 LAB — HIV ANTIBODY (ROUTINE TESTING W REFLEX): HIV 1&2 Ab, 4th Generation: NONREACTIVE

## 2020-05-14 ENCOUNTER — Encounter: Payer: Self-pay | Admitting: Internal Medicine

## 2020-05-14 NOTE — Assessment & Plan Note (Signed)
Handling stress.  Appears to be doing well.  Follow.

## 2020-07-23 ENCOUNTER — Other Ambulatory Visit: Payer: Self-pay | Admitting: Internal Medicine

## 2020-07-23 DIAGNOSIS — Z1231 Encounter for screening mammogram for malignant neoplasm of breast: Secondary | ICD-10-CM

## 2020-08-31 ENCOUNTER — Other Ambulatory Visit: Payer: Self-pay

## 2020-08-31 ENCOUNTER — Ambulatory Visit
Admission: RE | Admit: 2020-08-31 | Discharge: 2020-08-31 | Disposition: A | Discharge status: Home / Self Care | Payer: Self-pay | Source: Ambulatory Visit | Attending: Internal Medicine | Admitting: Internal Medicine

## 2020-08-31 DIAGNOSIS — Z1231 Encounter for screening mammogram for malignant neoplasm of breast: Secondary | ICD-10-CM | POA: Insufficient documentation

## 2020-09-14 ENCOUNTER — Encounter: Payer: Self-pay | Admitting: Internal Medicine

## 2020-09-14 NOTE — Telephone Encounter (Signed)
It is ok to refill.  They usually send [place] a written script to sign and fax back because is compounded.

## 2020-09-14 NOTE — Telephone Encounter (Signed)
Medication was refilled with 6 refills. Verbal order was given.

## 2021-01-04 ENCOUNTER — Encounter: Payer: Self-pay | Admitting: Internal Medicine

## 2021-01-04 NOTE — Telephone Encounter (Signed)
Faxed

## 2021-01-04 NOTE — Telephone Encounter (Signed)
Rx should be signed and in box.  Please fax.

## 2021-05-10 ENCOUNTER — Other Ambulatory Visit: Payer: Self-pay

## 2021-05-10 ENCOUNTER — Other Ambulatory Visit (HOSPITAL_COMMUNITY)
Admission: RE | Admit: 2021-05-10 | Discharge: 2021-05-10 | Disposition: A | Discharge status: Home / Self Care | Payer: Self-pay | Source: Ambulatory Visit | Attending: Internal Medicine | Admitting: Internal Medicine

## 2021-05-10 ENCOUNTER — Ambulatory Visit (INDEPENDENT_AMBULATORY_CARE_PROVIDER_SITE_OTHER): Payer: Self-pay | Admitting: Internal Medicine

## 2021-05-10 VITALS — BP 110/66 | HR 66 | Temp 97.9°F | Resp 16 | Ht 63.0 in | Wt 128.2 lb

## 2021-05-10 DIAGNOSIS — F439 Reaction to severe stress, unspecified: Secondary | ICD-10-CM

## 2021-05-10 DIAGNOSIS — Z8639 Personal history of other endocrine, nutritional and metabolic disease: Secondary | ICD-10-CM

## 2021-05-10 DIAGNOSIS — Z13220 Encounter for screening for lipoid disorders: Secondary | ICD-10-CM

## 2021-05-10 DIAGNOSIS — Z124 Encounter for screening for malignant neoplasm of cervix: Secondary | ICD-10-CM | POA: Insufficient documentation

## 2021-05-10 DIAGNOSIS — Z0000 Encounter for general adult medical examination without abnormal findings: Secondary | ICD-10-CM

## 2021-05-10 LAB — CBC WITH DIFFERENTIAL/PLATELET
Basophils Absolute: 0 10*3/uL (ref 0.0–0.1)
Basophils Relative: 0.8 % (ref 0.0–3.0)
Eosinophils Absolute: 0.3 10*3/uL (ref 0.0–0.7)
Eosinophils Relative: 5 % (ref 0.0–5.0)
HCT: 40.5 % (ref 36.0–46.0)
Hemoglobin: 13.5 g/dL (ref 12.0–15.0)
Lymphocytes Relative: 24.8 % (ref 12.0–46.0)
Lymphs Abs: 1.3 10*3/uL (ref 0.7–4.0)
MCHC: 33.4 g/dL (ref 30.0–36.0)
MCV: 86.9 fl (ref 78.0–100.0)
Monocytes Absolute: 0.5 10*3/uL (ref 0.1–1.0)
Monocytes Relative: 9 % (ref 3.0–12.0)
Neutro Abs: 3.1 10*3/uL (ref 1.4–7.7)
Neutrophils Relative %: 60.4 % (ref 43.0–77.0)
Platelets: 288 10*3/uL (ref 150.0–400.0)
RBC: 4.66 Mil/uL (ref 3.87–5.11)
RDW: 12.5 % (ref 11.5–15.5)
WBC: 5.1 10*3/uL (ref 4.0–10.5)

## 2021-05-10 LAB — COMPREHENSIVE METABOLIC PANEL
ALT: 15 U/L (ref 0–35)
AST: 17 U/L (ref 0–37)
Albumin: 4.5 g/dL (ref 3.5–5.2)
Alkaline Phosphatase: 72 U/L (ref 39–117)
BUN: 14 mg/dL (ref 6–23)
CO2: 30 mEq/L (ref 19–32)
Calcium: 9.4 mg/dL (ref 8.4–10.5)
Chloride: 104 mEq/L (ref 96–112)
Creatinine, Ser: 0.79 mg/dL (ref 0.40–1.20)
GFR: 80.63 mL/min (ref >60.00)
Glucose, Bld: 82 mg/dL (ref 70–99)
Potassium: 4.2 mEq/L (ref 3.5–5.1)
Sodium: 140 mEq/L (ref 135–145)
Total Bilirubin: 0.7 mg/dL (ref 0.2–1.2)
Total Protein: 6.7 g/dL (ref 6.0–8.3)

## 2021-05-10 LAB — LIPID PANEL
Cholesterol: 239 mg/dL — ABNORMAL HIGH (ref 0–200)
HDL: 69.5 mg/dL (ref >39.00)
LDL Cholesterol: 156 mg/dL — ABNORMAL HIGH (ref 0–99)
NonHDL: 169.74
Total CHOL/HDL Ratio: 3
Triglycerides: 70 mg/dL (ref 0.0–149.0)
VLDL: 14 mg/dL (ref 0.0–40.0)

## 2021-05-10 LAB — TSH: TSH: 1.1 u[IU]/mL (ref 0.35–5.50)

## 2021-05-10 NOTE — Progress Notes (Signed)
Patient ID: Bridget Gordon, female   DOB: 1960-10-17, 61 y.o.   MRN: [id number] ? ? ?Subjective:  ? ? Patient ID: Bridget Gordon, female    DOB: 29-Apr-1960, 61 y.o.   MRN: [id number] ? ?This visit occurred during the SARS-CoV-2 public health emergency.  Safety protocols were in place, including screening questions prior to the visit, additional usage of staff PPE, and extensive cleaning of exam room while observing appropriate contact time as indicated for disinfecting solutions.  ? ?Patient here for her physical exam.  ? ?Chief Complaint  ?Patient presents with  ? Annual Exam  ? .  ? ?HPI ?Here for her physical exam.  She is doing well.  Feels good.  Staying active.  No chest pain or sob reported.  No abdominal pain or bowel change reported.  Overall feels good.  Had questions about shingles vaccine  ? ? ?Past Medical History:  ?Diagnosis Date  ? Cancer [facility])   ? skin  ? Seasonal allergies   ? ?History reviewed. No pertinent surgical history. ?Family History  ?Problem Relation Age of Onset  ? Heart disease Father   ?     s/p CABG  ? Skin cancer Father   ? Skin cancer Mother   ? Diabetes Maternal Grandmother   ? Bone cancer Maternal Grandmother   ? Liver cancer Maternal Grandfather   ? Breast cancer Neg Hx   ? Colon cancer Neg Hx   ? ?Social History  ? ?Socioeconomic History  ? Marital status: Married  ?  Spouse name: Not on file  ? Number of children: 7  ? Years of education: Not on file  ? Highest education level: Not on file  ?Occupational History  ? Not on file  ?Tobacco Use  ? Smoking status: Never  ? Smokeless tobacco: Never  ?Substance and Sexual Activity  ? Alcohol use: No  ?  Alcohol/week: 0.0 standard drinks  ? 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 Sexual activity: Not on file  ?Other Topics Concern  ? Not on file  ?Social History Narrative  ? Not on file  ? ?Social Determinants of Health  ? ?Financial Resource Strain: Not on file  ?Food Insecurity: Not on file  ?Transportation Needs: Not on file  ?Physical  Activity: Not on file  ?Stress: Not on file  ?Social Connections: Not on file  ? ? ? ?Review of Systems  ?Constitutional:  Negative for appetite change and unexpected weight change.  ?HENT:  Negative for congestion, sinus pressure and sore throat.   ?Eyes:  Negative for pain and visual disturbance.  ?Respiratory:  Negative for cough, chest tightness and shortness of breath.   ?Cardiovascular:  Negative for chest pain, palpitations and leg swelling.  ?Gastrointestinal:  Negative for abdominal pain, diarrhea, nausea and vomiting.  ?Genitourinary:  Negative for difficulty urinating and dysuria.  ?Musculoskeletal:  Negative for joint swelling and myalgias.  ?Skin:  Negative for color change and rash.  ?Neurological:  Negative for dizziness, light-headedness and headaches.  ?Hematological:  Negative for adenopathy. Does not bruise/bleed easily.  ?Psychiatric/Behavioral:  Negative for agitation and dysphoric mood.   ? ?   ?Objective:  ?  ? ?BP 110/66   Pulse 66   Temp 97.9 ?F (36.6 ?C)   Resp 16   Ht '5\' 3"'$  (1.6 m)   Wt 128 lb 3.2 oz (58.2 kg)   LMP 10/13/2012   SpO2 98%   BMI 22.71 kg/m?  ?Wt Readings from Last 3  Encounters:  ?05/10/21 128 lb 3.2 oz (58.2 kg)  ?05/08/20 130 lb (59 kg)  ?05/05/19 129 lb 6.4 oz (58.7 kg)  ? ? ?Physical Exam ?Vitals reviewed.  ?Constitutional:   ?   General: She is not in acute distress. ?   Appearance: Normal appearance. She is well-developed.  ?HENT:  ?   Head: Normocephalic and atraumatic.  ?   Right Ear: External ear normal.  ?   Left Ear: External ear normal.  ?Eyes:  ?   General: No scleral icterus.    ?   Right eye: No discharge.     ?   Left eye: No discharge.  ?   Conjunctiva/sclera: Conjunctivae normal.  ?Neck:  ?   Thyroid: No thyromegaly.  ?Cardiovascular:  ?   Rate and Rhythm: Normal rate and regular rhythm.  ?Pulmonary:  ?   Effort: No tachypnea, accessory muscle usage or respiratory distress.  ?   Breath sounds: Normal breath sounds. No decreased breath sounds or  wheezing.  ?Chest:  ?Breasts: ?   Right: No inverted nipple, mass, nipple discharge or tenderness (no axillary adenopathy).  ?   Left: No inverted nipple, mass, nipple discharge or tenderness (no axilarry adenopathy).  ?Abdominal:  ?   General: Bowel sounds are normal.  ?   Palpations: Abdomen is soft.  ?   Tenderness: There is no abdominal tenderness.  ?Genitourinary: ?   Comments: Normal external genitalia.  Vaginal vault without lesions.  Cervix identified.  Pap smear performed.  Could not appreciate any adnexal masses or tenderness.   ?Musculoskeletal:     ?   General: No swelling or tenderness.  ?   Cervical back: Neck supple.  ?Lymphadenopathy:  ?   Cervical: No cervical adenopathy.  ?Skin: ?   Findings: No erythema or rash.  ?Neurological:  ?   Mental Status: She is alert and oriented to person, place, and time.  ?Psychiatric:     ?   Mood and Affect: Mood normal.     ?   Behavior: Behavior normal.  ? ? ? ?Outpatient Encounter Medications as of 05/10/2021  ?Medication Sig  ? UNABLE TO FIND   ? Multiple Vitamin (MULTIVITAMIN) tablet Take 1 tablet by mouth daily.  ? Omega-3 Fatty Acids (FISH OIL PO) Take by mouth.  ? VITAMIN D, ERGOCALCIFEROL, PO Take by mouth daily.  ? ?No facility-administered encounter medications on file as of 05/10/2021.  ?  ? ?Lab Results  ?Component Value Date  ? WBC 5.1 05/10/2021  ? HGB 13.5 05/10/2021  ? HCT 40.5 05/10/2021  ? PLT 288.0 05/10/2021  ? GLUCOSE 82 05/10/2021  ? CHOL 239 (H) 05/10/2021  ? TRIG 70.0 05/10/2021  ? HDL 69.50 05/10/2021  ? LDLCALC 156 (H) 05/10/2021  ? ALT 15 05/10/2021  ? AST 17 05/10/2021  ? NA 140 05/10/2021  ? K 4.2 05/10/2021  ? CL 104 05/10/2021  ? CREATININE 0.79 05/10/2021  ? BUN 14 05/10/2021  ? CO2 30 05/10/2021  ? TSH 1.10 05/10/2021  ? ? ?MM 3D SCREEN BREAST BILATERAL ? ?Result Date: 09/05/2020 ?CLINICAL DATA:  Screening. EXAM: DIGITAL SCREENING BILATERAL MAMMOGRAM WITH TOMOSYNTHESIS AND CAD TECHNIQUE: Bilateral screening digital craniocaudal and  mediolateral oblique mammograms were obtained. Bilateral screening digital breast tomosynthesis was performed. The images were evaluated with computer-aided detection. COMPARISON:  Previous exam(s). ACR Breast Density Category c: The breast tissue is heterogeneously dense, which may obscure small masses. FINDINGS: There are no findings suspicious for malignancy. IMPRESSION: No mammographic evidence  of malignancy. A result letter of this screening mammogram will be mailed directly to the patient. RECOMMENDATION: Screening mammogram in one year. (Code:SM-B-01Y) BI-RADS CATEGORY  1: Negative. Electronically Signed   By: Lillia Mountain M.D.   On: 09/05/2020 08:34  ? ? ?   ?Assessment & Plan:  ? ?Problem List Items Addressed This Visit   ? ? Health care maintenance  ?  Physical today 05/10/21. PAP 05/10/21 .  Mammogram 09/05/20 - Birads I.  Colonoscopy 07/2015.  ?  ?  ? History of iron deficiency  ?  Check cbc today.  ?  ?  ? Relevant Orders  ? CBC with Differential/Platelet (Completed)  ? Comprehensive metabolic panel (Completed)  ? TSH (Completed)  ? Stress  ?  Overall appears to be doing well.  Follow.  ?  ?  ? ?Other Visit Diagnoses   ? ? Routine general medical examination at a health care facility    -  Primary  ? Screening cholesterol level      ? Relevant Orders  ? Lipid panel (Completed)  ? Cervical cancer screening      ? Relevant Orders  ? Cytology - PAP( [place])  ? ?  ? ? ? ?Einar Pheasant, MD  ?

## 2021-05-10 NOTE — Assessment & Plan Note (Addendum)
Physical today 05/10/21. PAP 05/10/21 .  Mammogram 09/05/20 - Birads I.  Colonoscopy 07/2015.  ?

## 2021-05-12 ENCOUNTER — Encounter: Payer: Self-pay | Admitting: Internal Medicine

## 2021-05-12 NOTE — Assessment & Plan Note (Signed)
Check cbc today 

## 2021-05-12 NOTE — Assessment & Plan Note (Signed)
Overall appears to be doing well.  Follow.  

## 2021-05-13 LAB — CYTOLOGY - PAP
Comment: NEGATIVE
Diagnosis: NEGATIVE
High risk HPV: NEGATIVE

## 2021-05-14 ENCOUNTER — Encounter: Payer: Self-pay | Admitting: Internal Medicine

## 2021-06-14 ENCOUNTER — Other Ambulatory Visit: Payer: Self-pay

## 2021-06-14 ENCOUNTER — Encounter: Payer: Self-pay | Admitting: Internal Medicine

## 2021-07-18 ENCOUNTER — Other Ambulatory Visit: Payer: Self-pay | Admitting: Internal Medicine

## 2021-07-18 DIAGNOSIS — Z1231 Encounter for screening mammogram for malignant neoplasm of breast: Secondary | ICD-10-CM

## 2021-09-05 ENCOUNTER — Ambulatory Visit
Admission: RE | Admit: 2021-09-05 | Discharge: 2021-09-05 | Disposition: A | Discharge status: Home / Self Care | Payer: Self-pay | Source: Ambulatory Visit | Attending: Internal Medicine | Admitting: Internal Medicine

## 2021-09-05 DIAGNOSIS — Z1231 Encounter for screening mammogram for malignant neoplasm of breast: Secondary | ICD-10-CM | POA: Insufficient documentation

## 2021-12-25 LAB — TESTOSTERONE: Testosterone: 24

## 2022-01-01 ENCOUNTER — Other Ambulatory Visit: Payer: Self-pay

## 2022-05-07 IMAGING — MG MM DIGITAL SCREENING BILAT W/ TOMO AND CAD
6 of 10 series · 6 of 30 positions shown · non-contrast
Comparison: Previous exam(s).

CLINICAL DATA: Screening.

EXAM:
DIGITAL SCREENING BILATERAL MAMMOGRAM WITH TOMOSYNTHESIS AND CAD
TECHNIQUE: Bilateral screening digital craniocaudal and mediolateral oblique
mammograms were obtained. Bilateral screening digital breast
tomosynthesis was performed. The images were evaluated with
computer-aided detection.

[L MLO synth-2D (1 of 2)]
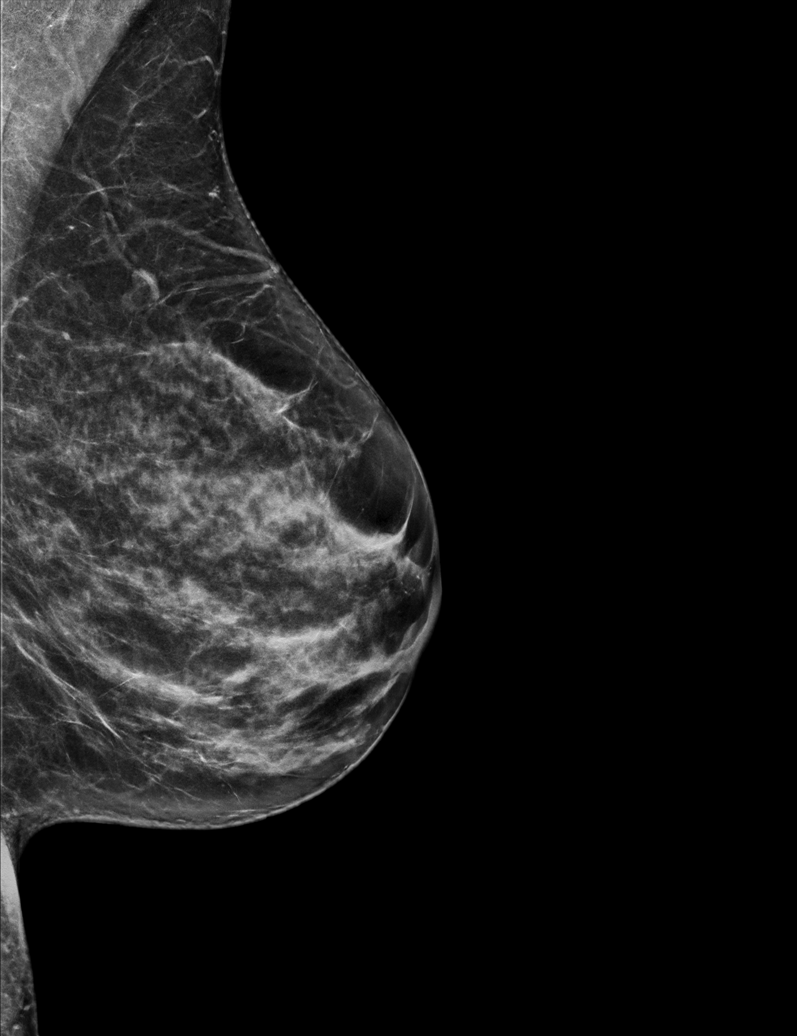

[L CC synth-2D]
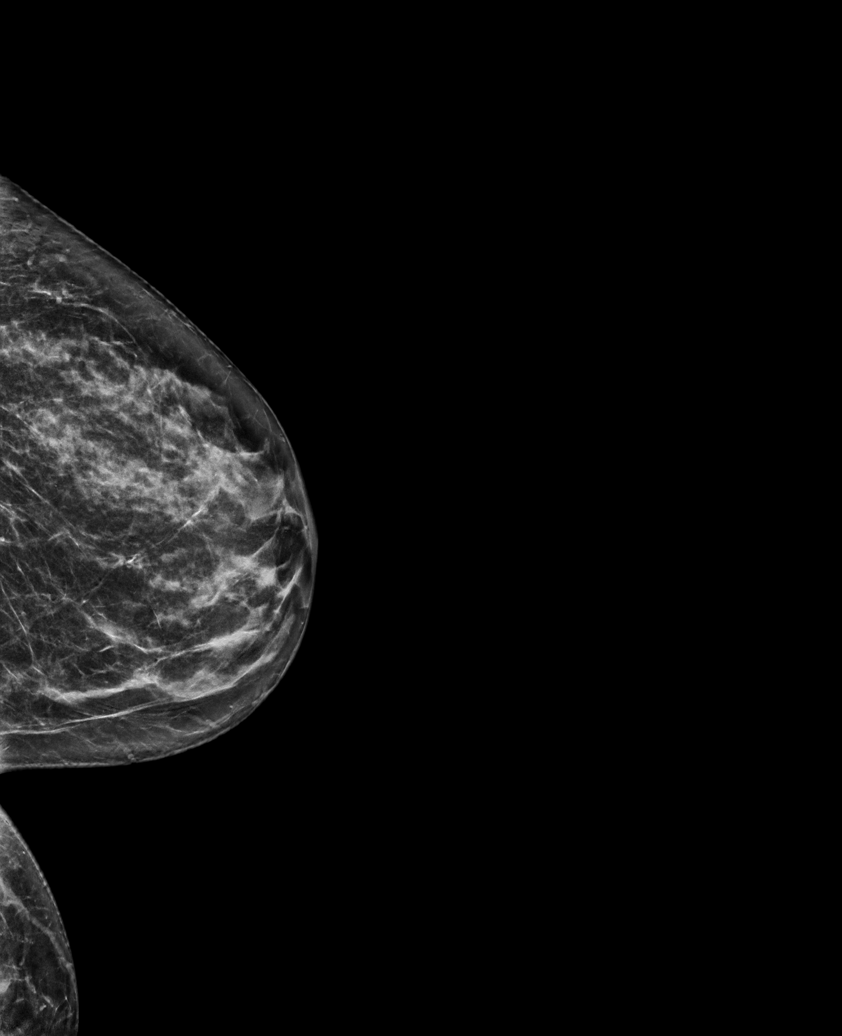

[L MLO synth-2D (2 of 2)]
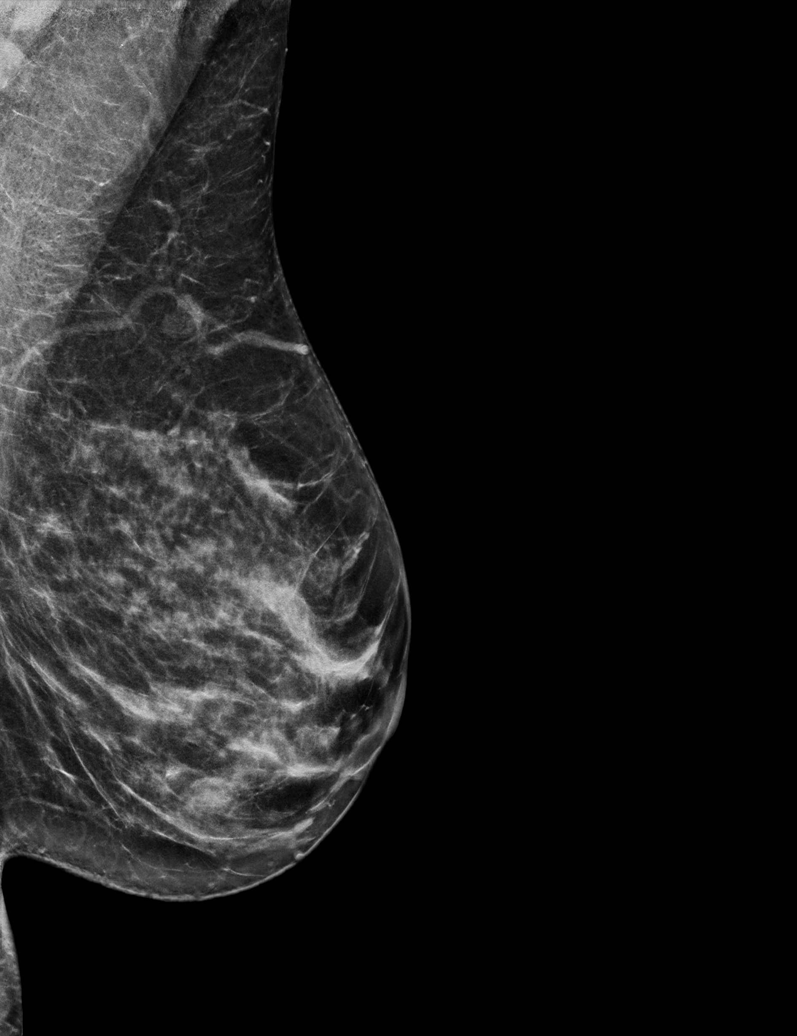

[R MLO synth-2D]
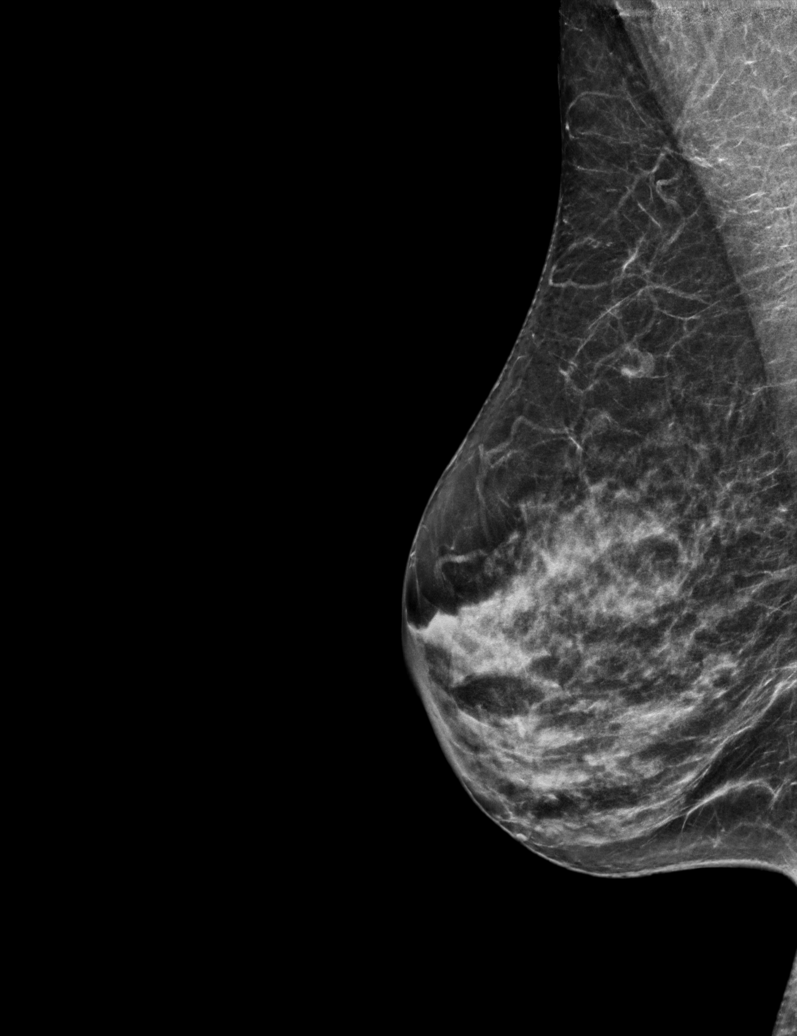

[R CC synth-2D]
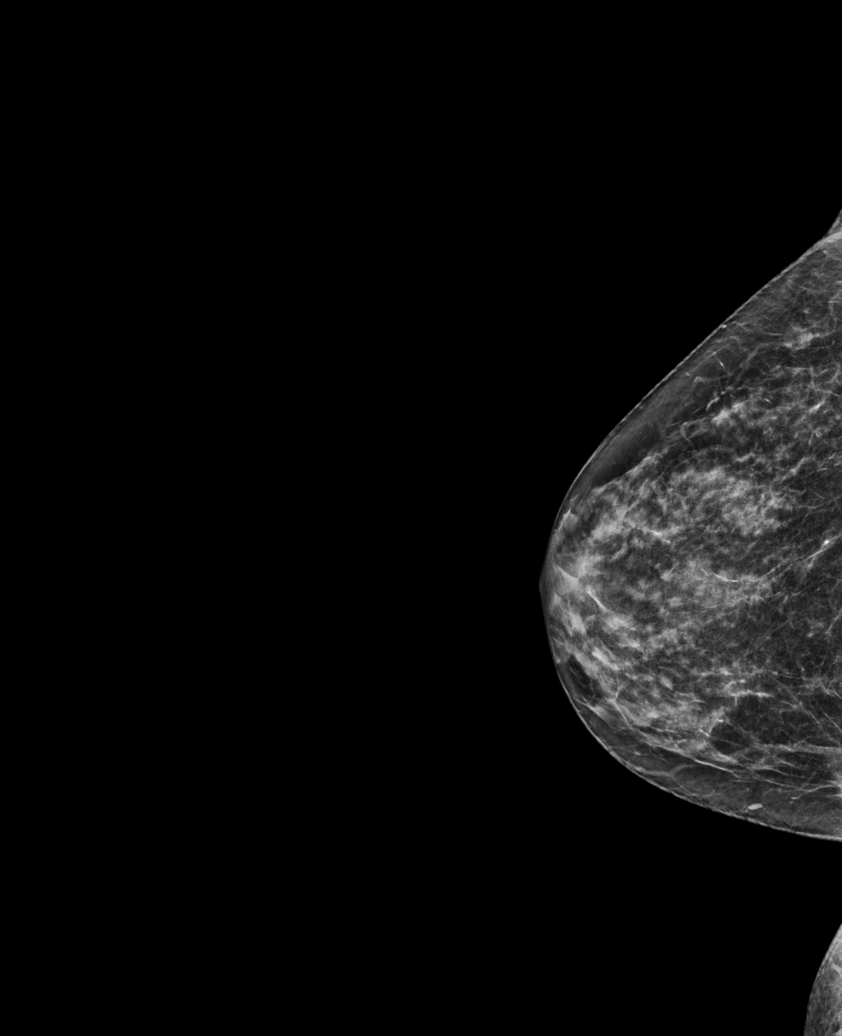

[R MLO tomo · tomo slice 31/60.0]
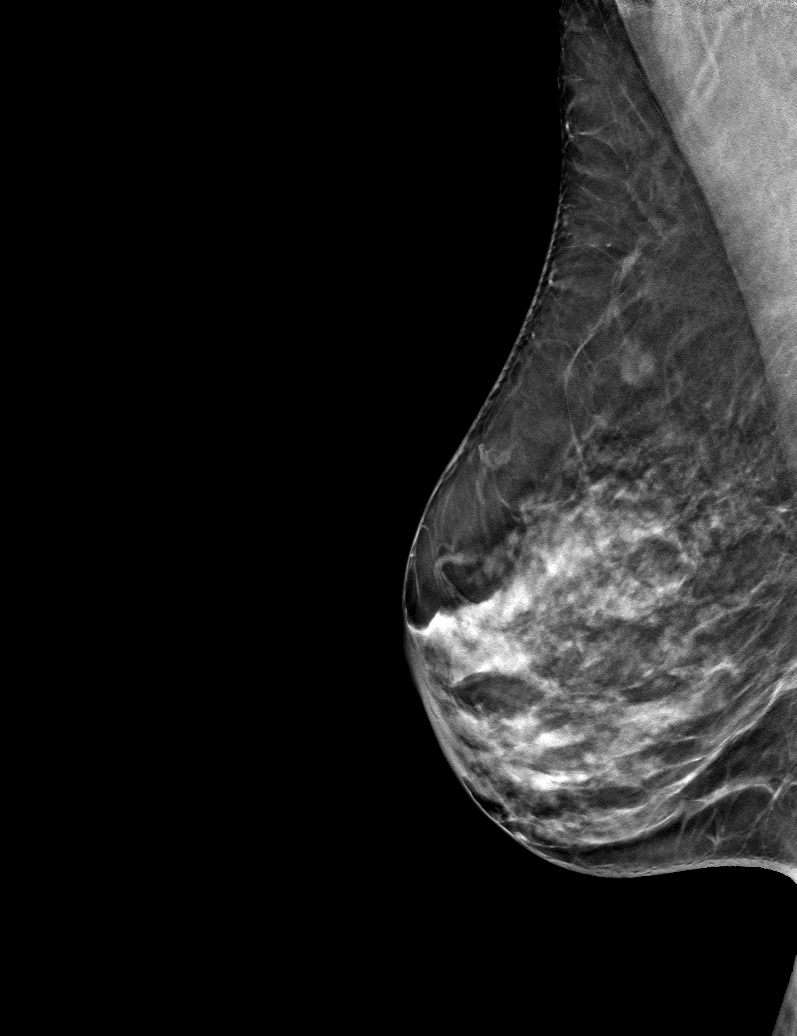

[6 of 30 positions shown; findings below may reference images not displayed]

ACR Breast Density Category c: The breast tissue is heterogeneously
dense, which may obscure small masses.
FINDINGS: There are no findings suspicious for malignancy.
IMPRESSION: No mammographic evidence of malignancy. A result letter of this
screening mammogram will be mailed directly to the patient.

RECOMMENDATION:
Screening mammogram in one year. (Code:Q3-W-BC3)

BI-RADS CATEGORY  1: Negative.

## 2022-05-12 ENCOUNTER — Encounter: Payer: Self-pay | Admitting: Internal Medicine

## 2022-05-12 ENCOUNTER — Ambulatory Visit (INDEPENDENT_AMBULATORY_CARE_PROVIDER_SITE_OTHER): Payer: Self-pay | Admitting: Internal Medicine

## 2022-05-12 VITALS — BP 112/70 | HR 68 | Temp 97.9°F | Resp 16 | Ht 63.0 in | Wt 128.8 lb

## 2022-05-12 DIAGNOSIS — Z1231 Encounter for screening mammogram for malignant neoplasm of breast: Secondary | ICD-10-CM

## 2022-05-12 DIAGNOSIS — Z13220 Encounter for screening for lipoid disorders: Secondary | ICD-10-CM

## 2022-05-12 DIAGNOSIS — Z0000 Encounter for general adult medical examination without abnormal findings: Secondary | ICD-10-CM

## 2022-05-12 DIAGNOSIS — Z8639 Personal history of other endocrine, nutritional and metabolic disease: Secondary | ICD-10-CM

## 2022-05-12 DIAGNOSIS — F439 Reaction to severe stress, unspecified: Secondary | ICD-10-CM

## 2022-05-12 LAB — COMPREHENSIVE METABOLIC PANEL WITH GFR
ALT: 23 U/L (ref 0–35)
AST: 21 U/L (ref 0–37)
Albumin: 4 g/dL (ref 3.5–5.2)
Alkaline Phosphatase: 62 U/L (ref 39–117)
BUN: 11 mg/dL (ref 6–23)
CO2: 29 meq/L (ref 19–32)
Calcium: 9.2 mg/dL (ref 8.4–10.5)
Chloride: 102 meq/L (ref 96–112)
Creatinine, Ser: 0.74 mg/dL (ref 0.40–1.20)
GFR: 86.59 mL/min (ref >60.00)
Glucose, Bld: 83 mg/dL (ref 70–99)
Potassium: 3.9 meq/L (ref 3.5–5.1)
Sodium: 139 meq/L (ref 135–145)
Total Bilirubin: 0.6 mg/dL (ref 0.2–1.2)
Total Protein: 6.9 g/dL (ref 6.0–8.3)

## 2022-05-12 LAB — CBC WITH DIFFERENTIAL/PLATELET
Basophils Absolute: 0 10*3/uL (ref 0.0–0.1)
Basophils Relative: 0.7 % (ref 0.0–3.0)
Eosinophils Absolute: 0.3 10*3/uL (ref 0.0–0.7)
Eosinophils Relative: 5.3 % — ABNORMAL HIGH (ref 0.0–5.0)
HCT: 41.1 % (ref 36.0–46.0)
Hemoglobin: 14.1 g/dL (ref 12.0–15.0)
Lymphocytes Relative: 24.5 % (ref 12.0–46.0)
Lymphs Abs: 1.3 10*3/uL (ref 0.7–4.0)
MCHC: 34.2 g/dL (ref 30.0–36.0)
MCV: 86.1 fl (ref 78.0–100.0)
Monocytes Absolute: 0.4 10*3/uL (ref 0.1–1.0)
Monocytes Relative: 7.7 % (ref 3.0–12.0)
Neutro Abs: 3.3 10*3/uL (ref 1.4–7.7)
Neutrophils Relative %: 61.8 % (ref 43.0–77.0)
Platelets: 298 10*3/uL (ref 150.0–400.0)
RBC: 4.78 Mil/uL (ref 3.87–5.11)
RDW: 12.5 % (ref 11.5–15.5)
WBC: 5.3 10*3/uL (ref 4.0–10.5)

## 2022-05-12 LAB — LIPID PANEL
Cholesterol: 231 mg/dL — ABNORMAL HIGH (ref 0–200)
HDL: 66 mg/dL (ref >39.00)
LDL Cholesterol: 149 mg/dL — ABNORMAL HIGH (ref 0–99)
NonHDL: 165.22
Total CHOL/HDL Ratio: 4
Triglycerides: 81 mg/dL (ref 0.0–149.0)
VLDL: 16.2 mg/dL (ref 0.0–40.0)

## 2022-05-12 LAB — TSH: TSH: 0.98 u[IU]/mL (ref 0.35–5.50)

## 2022-05-12 NOTE — Assessment & Plan Note (Signed)
Physical today 05/12/22. PAP 05/10/21 - negative with atrophic changes.  Negative HPV.  Mammogram 09/05/21- Birads I.  Colonoscopy 07/2015.

## 2022-05-12 NOTE — Progress Notes (Unsigned)
Subjective:    Patient ID: Bridget Gordon, female    DOB: Apr 29, 1960, 62 y.o.   MRN: [id number]  Patient here for  Chief Complaint  Patient presents with   Annual Exam    HPI Here for physical exam.  She is doing well.  Some increased stress with her mother and father's health issues.  Overall appears to be handling things relatively well.  Stays active.  No chest pain or sob reported.  No abdominal pain or bowel change reported.     Past Medical History:  Diagnosis Date   Cancer ([place])    skin   Seasonal allergies    History reviewed. No pertinent surgical history. Family History  Problem Relation Age of Onset   Heart disease Father        s/p CABG   Skin cancer Father    Skin cancer Mother    Diabetes Maternal Grandmother    Bone cancer Maternal Grandmother    Liver cancer Maternal Grandfather    Breast cancer Neg Hx    Colon cancer Neg Hx    Social History   Socioeconomic History   Marital status: Married    Spouse name: Not on file   Number of children: 7   Years of education: Not on file   Highest education level: Not on file  Occupational History   Not on file  Tobacco Use   Smoking status: Never   Smokeless tobacco: Never  Substance and Sexual Activity   Alcohol use: No    Alcohol/week: 0.0 standard drinks of alcohol   Drug use: Yes    Types: Fentanyl   Sexual activity: Not on file  Other Topics Concern   Not on file  Social History Narrative   Not on file   Social Determinants of Health   Financial Resource Strain: Not on file  Food Insecurity: Not on file  Transportation Needs: Not on file  Physical Activity: Not on file  Stress: Not on file  Social Connections: Not on file     Review of Systems  Constitutional:  Negative for appetite change and unexpected weight change.  HENT:  Negative for congestion, sinus pressure and sore throat.   Eyes:  Negative for pain and visual disturbance.  Respiratory:  Negative for cough, chest tightness  and shortness of breath.   Cardiovascular:  Negative for chest pain, palpitations and leg swelling.  Gastrointestinal:  Negative for abdominal pain, diarrhea, nausea and vomiting.  Genitourinary:  Negative for difficulty urinating and dysuria.  Musculoskeletal:  Negative for joint swelling and myalgias.  Skin:  Negative for color change and rash.  Neurological:  Negative for dizziness and headaches.  Hematological:  Negative for adenopathy. Does not bruise/bleed easily.  Psychiatric/Behavioral:  Negative for agitation and dysphoric mood.        Objective:     BP 112/70   Pulse 68   Temp 97.9 F (36.6 C)   Resp 16   Ht '5\' 3"'$  (1.6 m)   Wt 128 lb 12.8 oz (58.4 kg)   LMP 10/13/2012   SpO2 97%   BMI 22.82 kg/m  Wt Readings from Last 3 Encounters:  05/12/22 128 lb 12.8 oz (58.4 kg)  05/10/21 128 lb 3.2 oz (58.2 kg)  05/08/20 130 lb (59 kg)    Physical Exam Vitals reviewed.  Constitutional:      General: She is not in acute distress.    Appearance: Normal appearance. She is well-developed.  HENT:  Head: Normocephalic and atraumatic.     Right Ear: External ear normal.     Left Ear: External ear normal.  Eyes:     General: No scleral icterus.       Right eye: No discharge.        Left eye: No discharge.     Conjunctiva/sclera: Conjunctivae normal.  Neck:     Thyroid: No thyromegaly.  Cardiovascular:     Rate and Rhythm: Normal rate and regular rhythm.  Pulmonary:     Effort: No tachypnea, accessory muscle usage or respiratory distress.     Breath sounds: Normal breath sounds. No decreased breath sounds or wheezing.  Chest:  Breasts:    Right: No inverted nipple, mass, nipple discharge or tenderness (no axillary adenopathy).     Left: No inverted nipple, mass, nipple discharge or tenderness (no axilarry adenopathy).  Abdominal:     General: Bowel sounds are normal.     Palpations: Abdomen is soft.     Tenderness: There is no abdominal tenderness.   Musculoskeletal:        General: No swelling or tenderness.     Cervical back: Neck supple.  Lymphadenopathy:     Cervical: No cervical adenopathy.  Skin:    Findings: No erythema or rash.  Neurological:     Mental Status: She is alert and oriented to person, place, and time.  Psychiatric:        Mood and Affect: Mood normal.        Behavior: Behavior normal.      Outpatient Encounter Medications as of 05/12/2022  Medication Sig   Multiple Vitamin (MULTIVITAMIN) tablet Take 1 tablet by mouth daily.   Omega-3 Fatty Acids (FISH OIL PO) Take by mouth.   UNABLE TO FIND    VITAMIN D, ERGOCALCIFEROL, PO Take by mouth daily.   No facility-administered encounter medications on file as of 05/12/2022.     Lab Results  Component Value Date   WBC 5.3 05/12/2022   HGB 14.1 05/12/2022   HCT 41.1 05/12/2022   PLT 298.0 05/12/2022   GLUCOSE 83 05/12/2022   CHOL 231 (H) 05/12/2022   TRIG 81.0 05/12/2022   HDL 66.00 05/12/2022   LDLCALC 149 (H) 05/12/2022   ALT 23 05/12/2022   AST 21 05/12/2022   NA 139 05/12/2022   K 3.9 05/12/2022   CL 102 05/12/2022   CREATININE 0.74 05/12/2022   BUN 11 05/12/2022   CO2 29 05/12/2022   TSH 0.98 05/12/2022    MM 3D SCREEN BREAST BILATERAL  Result Date: 09/05/2021 CLINICAL DATA:  Screening. EXAM: DIGITAL SCREENING BILATERAL MAMMOGRAM WITH TOMOSYNTHESIS AND CAD TECHNIQUE: Bilateral screening digital craniocaudal and mediolateral oblique mammograms were obtained. Bilateral screening digital breast tomosynthesis was performed. The images were evaluated with computer-aided detection. COMPARISON:  Previous exam(s). ACR Breast Density Category c: The breast tissue is heterogeneously dense, which may obscure small masses. FINDINGS: There are no findings suspicious for malignancy. IMPRESSION: No mammographic evidence of malignancy. A result letter of this screening mammogram will be mailed directly to the patient. RECOMMENDATION: Screening mammogram in one  year. (Code:SM-B-01Y) BI-RADS CATEGORY  1: Negative. Electronically Signed   By: Lovey Newcomer M.D.   On: 09/05/2021 14:21       Assessment & Plan:  Routine general medical examination at a health care facility  Health care maintenance Assessment & Plan: Physical today 05/12/22. PAP 05/10/21 - negative with atrophic changes.  Negative HPV.  Mammogram 09/05/21- Birads I.  Colonoscopy 07/2015.  History of iron deficiency Assessment & Plan: Follow cbc.  Check cbc today.   Orders: -     CBC with Differential/Platelet -     Comprehensive metabolic panel -     TSH  Screening cholesterol level -     Lipid panel  Visit for screening mammogram -     3D Screening Mammogram, Left and Right; Future  Stress Assessment & Plan: Increased stress with her parents health issues.  Discussed. Overall appears to be doing well.  Follow. Notify me if feels needs any further intervention.       Einar Pheasant, MD

## 2022-05-12 NOTE — Patient Instructions (Signed)
  [name] [phone] [email or web address] 

## 2022-05-13 ENCOUNTER — Encounter: Payer: Self-pay | Admitting: Internal Medicine

## 2022-05-13 NOTE — Assessment & Plan Note (Signed)
Follow cbc.  Check cbc today.

## 2022-05-13 NOTE — Assessment & Plan Note (Signed)
Increased stress with her parents health issues.  Discussed. Overall appears to be doing well.  Follow. Notify me if feels needs any further intervention.

## 2022-06-25 ENCOUNTER — Encounter: Payer: Self-pay | Admitting: Internal Medicine

## 2022-06-26 NOTE — Telephone Encounter (Signed)
Signed and placed in box.   

## 2022-06-30 NOTE — Telephone Encounter (Signed)
Prescription sent

## 2022-09-09 ENCOUNTER — Ambulatory Visit
Admission: RE | Admit: 2022-09-09 | Discharge: 2022-09-09 | Disposition: A | Discharge status: Home / Self Care | Payer: Self-pay | Source: Ambulatory Visit | Attending: Internal Medicine | Admitting: Internal Medicine

## 2022-09-09 DIAGNOSIS — Z1231 Encounter for screening mammogram for malignant neoplasm of breast: Secondary | ICD-10-CM | POA: Insufficient documentation

## 2023-02-02 ENCOUNTER — Encounter: Payer: Self-pay | Admitting: Internal Medicine

## 2023-02-03 NOTE — Telephone Encounter (Signed)
Called patient to confirm evaluated. Pt was seen at the walk in

## 2023-02-03 NOTE — Telephone Encounter (Signed)
FYI: This call has been transferred to Access Nurse. Once the result note has been entered staff can address the message at that time.  Patient called in with the following symptoms:  Red Word: chest tightness   Please advise at Mobile [phone] (mobile)  Message is routed to Provider Pool and [facility] [place] Triage   Spoke to pt, pt wanted to know if she should wait on Dr. Roby Lofts response regarding her message, sent via mychart, or schedule an appt? Told pt if she wanted anything prescribed, she'd need an appt. No available slots today in office, offered slots at other offices, pt declined. Pt states she'd wait for Dr. Roby Lofts response to her mychart message. Transferred pt to access nurse.

## 2023-02-17 ENCOUNTER — Encounter: Payer: Self-pay | Admitting: Internal Medicine

## 2023-02-18 ENCOUNTER — Ambulatory Visit
Admission: RE | Admit: 2023-02-18 | Discharge: 2023-02-18 | Disposition: A | Discharge status: Home / Self Care | Payer: Self-pay | Attending: Family Medicine | Admitting: Family Medicine

## 2023-02-18 ENCOUNTER — Ambulatory Visit
Admission: RE | Admit: 2023-02-18 | Discharge: 2023-02-18 | Disposition: A | Discharge status: Home / Self Care | Payer: Self-pay | Source: Ambulatory Visit | Attending: Family Medicine | Admitting: Family Medicine

## 2023-02-18 ENCOUNTER — Ambulatory Visit (INDEPENDENT_AMBULATORY_CARE_PROVIDER_SITE_OTHER): Payer: Self-pay | Admitting: Family Medicine

## 2023-02-18 ENCOUNTER — Ambulatory Visit: Payer: Self-pay | Admitting: Family Medicine

## 2023-02-18 VITALS — BP 116/70 | HR 70 | Temp 98.3°F | Ht 63.0 in | Wt 131.4 lb

## 2023-02-18 DIAGNOSIS — R051 Acute cough: Secondary | ICD-10-CM

## 2023-02-18 DIAGNOSIS — R052 Subacute cough: Secondary | ICD-10-CM | POA: Insufficient documentation

## 2023-02-18 MED ORDER — AMOXICILLIN-POT CLAVULANATE 875-125 MG PO TABS: 1.0000 | ORAL_TABLET | Freq: Two times a day (BID) | ORAL | 0 refills | Status: DC

## 2023-02-18 NOTE — Progress Notes (Signed)
  Marikay Alar, MD Phone: [phone]  Bridget Gordon is a 62 y.o. female who presents today for same-day visit.  Cough: Patient notes this has been going on for a few weeks.  A couple of weeks ago she was seen at the walk-in clinic and treated with azithromycin, prednisone, and cough medication.  She noted some improvement though over the last several days she has started to feel worse with cough and more congestion in her chest.  She notes no fevers.  Does note some dyspnea when she is up moving around though none at rest.  No head congestion.  No postnasal drip.  No sore throat.  Social History   Tobacco Use  Smoking Status Never  Smokeless Tobacco Never    Current Outpatient Medications on File Prior to Visit  Medication Sig Dispense Refill   Multiple Vitamin (MULTIVITAMIN) tablet Take 1 tablet by mouth daily.     Omega-3 Fatty Acids (FISH OIL PO) Take by mouth.     UNABLE TO FIND      VITAMIN D, ERGOCALCIFEROL, PO Take by mouth daily.     No current facility-administered medications on file prior to visit.     ROS see history of present illness  Objective  Physical Exam Vitals:   02/18/23 1535  BP: 116/70  Pulse: 70  Temp: 98.3 F (36.8 C)  SpO2: 97%    BP Readings from Last 3 Encounters:  02/18/23 116/70  05/12/22 112/70  05/10/21 110/66   Wt Readings from Last 3 Encounters:  02/18/23 131 lb 6.4 oz (59.6 kg)  05/12/22 128 lb 12.8 oz (58.4 kg)  05/10/21 128 lb 3.2 oz (58.2 kg)    Physical Exam Constitutional:      General: She is not in acute distress.    Appearance: She is not diaphoretic.  Cardiovascular:     Rate and Rhythm: Normal rate and regular rhythm.     Heart sounds: Normal heart sounds.  Pulmonary:     Effort: Pulmonary effort is normal.     Comments: Left midlung field with crackles and squeak noted, the rest of her lung fields sound clear Skin:    General: Skin is warm and dry.  Neurological:     Mental Status: She is alert.       Assessment/Plan: Please see individual problem list.  Acute cough Assessment & Plan: Symptoms concerning for pneumonia based on exam and history.  We will treat with Augmentin 1 tablet twice daily for 7 days.  Will get a chest x-ray.  Could consider adding doxycycline if the chest x-ray reveals pneumonia though she was just on azithromycin which in theory would cover for similar organisms.  Advised on the risk of diarrhea with antibiotics.  Advised to seek medical attention for worsening shortness of breath, fevers, or any new or worsening symptoms.  Orders: -     DG Chest 2 View; Future -     Amoxicillin-Pot Clavulanate; Take 1 tablet by mouth 2 (two) times daily.  Dispense: 14 tablet; Refill: 0     Return if symptoms worsen or fail to improve.   Marikay Alar, MD [facility]

## 2023-02-18 NOTE — Assessment & Plan Note (Signed)
Symptoms concerning for pneumonia based on exam and history.  We will treat with Augmentin 1 tablet twice daily for 7 days.  Will get a chest x-ray.  Could consider adding doxycycline if the chest x-ray reveals pneumonia though she was just on azithromycin which in theory would cover for similar organisms.  Advised on the risk of diarrhea with antibiotics.  Advised to seek medical attention for worsening shortness of breath, fevers, or any new or worsening symptoms.

## 2023-02-19 ENCOUNTER — Ambulatory Visit: Payer: Self-pay | Admitting: Family Medicine

## 2023-02-19 NOTE — Telephone Encounter (Signed)
Patient saw Dr Birdie Sons yesterday.

## 2023-04-02 NOTE — Telephone Encounter (Signed)
Patient has been scheduled for evaluation

## 2023-04-03 ENCOUNTER — Ambulatory Visit (INDEPENDENT_AMBULATORY_CARE_PROVIDER_SITE_OTHER): Payer: Self-pay | Admitting: Nurse Practitioner

## 2023-04-03 ENCOUNTER — Encounter: Payer: Self-pay | Admitting: Nurse Practitioner

## 2023-04-03 VITALS — BP 120/78 | HR 73 | Temp 97.6°F | Ht 63.0 in | Wt 133.2 lb

## 2023-04-03 DIAGNOSIS — J069 Acute upper respiratory infection, unspecified: Secondary | ICD-10-CM

## 2023-04-03 MED ORDER — DOXYCYCLINE HYCLATE 100 MG PO TABS: 100.0000 mg | ORAL_TABLET | Freq: Two times a day (BID) | ORAL | 0 refills | Status: AC

## 2023-04-03 NOTE — Progress Notes (Unsigned)
Established Patient Office Visit  Subjective:  Patient ID: Bridget Gordon, female    DOB: August 04, 1960  Age: 63 y.o. MRN: [id number]  CC:  Chief Complaint  Patient presents with   Cough    Cough & congestion    HPI  Bridget Gordon presents for:  Cough Pertinent negatives include no fever.     Past Medical History:  Diagnosis Date   Cancer (HCC)    skin   Seasonal allergies     History reviewed. No pertinent surgical history.  Family History  Problem Relation Age of Onset   Heart disease Father        s/p CABG   Skin cancer Father    Skin cancer Mother    Diabetes Maternal Grandmother    Bone cancer Maternal Grandmother    Liver cancer Maternal Grandfather    Breast cancer Neg Hx    Colon cancer Neg Hx     Social History   Socioeconomic History   Marital status: Married    Spouse name: Not on file   Number of children: 7   Years of education: Not on file   Highest education level: Bachelor's degree (e.g., BA, AB, BS)  Occupational History   Not on file  Tobacco Use   Smoking status: Never   Smokeless tobacco: Never  Substance and Sexual Activity   Alcohol use: No    Alcohol/week: 0.0 standard drinks of alcohol   Drug use: Yes    Types: Fentanyl   Sexual activity: Not on file  Other Topics Concern   Not on file  Social History Narrative   Not on file   Social Drivers of Health   Financial Resource Strain: Low Risk  (02/18/2023)   Overall Financial Resource Strain (CARDIA)    Difficulty of Paying Living Expenses: Not hard at all  Food Insecurity: No Food Insecurity (02/18/2023)   Hunger Vital Sign    Worried About Running Out of Food in the Last Year: Never true    Ran Out of Food in the Last Year: Never true  Transportation Needs: No Transportation Needs (02/18/2023)   PRAPARE - Administrator, Civil Service (Medical): No    Lack of Transportation (Non-Medical): No  Physical Activity: Sufficiently Active (02/18/2023)   Exercise  Vital Sign    Days of Exercise per Week: 5 days    Minutes of Exercise per Session: 30 min  Stress: No Stress Concern Present (02/18/2023)   [employer] of Occupational Health - Occupational Stress Questionnaire    Feeling of Stress : Only a little  Social Connections: Socially Integrated (02/18/2023)   Social Connection and Isolation Panel [NHANES]    Frequency of Communication with Friends and Family: More than three times a week    Frequency of Social Gatherings with Friends and Family: More than three times a week    Attends Religious Services: More than 4 times per year    Active Member of [employer] or Organizations: Yes    Attends Engineer, structural: More than 4 times per year    Marital Status: Married  Catering manager Violence: Not on file     Outpatient Medications Prior to Visit  Medication Sig Dispense Refill   Multiple Vitamin (MULTIVITAMIN) tablet Take 1 tablet by mouth daily.     Omega-3 Fatty Acids (FISH OIL PO) Take by mouth.     UNABLE TO FIND      VITAMIN D, ERGOCALCIFEROL, PO Take by mouth daily.  amoxicillin-clavulanate (AUGMENTIN) 875-125 MG tablet Take 1 tablet by mouth 2 (two) times daily. 14 tablet 0   No facility-administered medications prior to visit.    No Known Allergies  ROS Review of Systems  Constitutional:  Negative for fever.  Respiratory:  Positive for cough.    Negative unless indicated in HPI.    Objective:    Physical Exam  BP 120/78   Pulse 73   Temp 97.6 F (36.4 C)   Ht 5\' 3"  (1.6 m)   Wt 133 lb 3.2 oz (60.4 kg)   LMP 10/13/2012   SpO2 98%   BMI 23.60 kg/m  Wt Readings from Last 3 Encounters:  04/03/23 133 lb 3.2 oz (60.4 kg)  02/18/23 131 lb 6.4 oz (59.6 kg)  05/12/22 128 lb 12.8 oz (58.4 kg)     Health Maintenance  Topic Date Due   Zoster Vaccines- Shingrix (1 of 2) Never done   COVID-19 Vaccine (3 - 2024-25 season) 04/18/2023 (Originally 11/02/2022)   INFLUENZA VACCINE  06/01/2023 (Originally  10/02/2022)   MAMMOGRAM  09/09/2023   DTaP/Tdap/Td (2 - Td or Tdap) 04/22/2025   Colonoscopy  07/17/2025   Cervical Cancer Screening (HPV/Pap Cotest)  05/11/2026   Hepatitis C Screening  Completed   HIV Screening  Completed   HPV VACCINES  Aged Out    There are no preventive care reminders to display for this patient.  Lab Results  Component Value Date   TSH 0.98 05/12/2022   Lab Results  Component Value Date   WBC 5.3 05/12/2022   HGB 14.1 05/12/2022   HCT 41.1 05/12/2022   MCV 86.1 05/12/2022   PLT 298.0 05/12/2022   Lab Results  Component Value Date   NA 139 05/12/2022   K 3.9 05/12/2022   CO2 29 05/12/2022   GLUCOSE 83 05/12/2022   BUN 11 05/12/2022   CREATININE 0.74 05/12/2022   BILITOT 0.6 05/12/2022   ALKPHOS 62 05/12/2022   AST 21 05/12/2022   ALT 23 05/12/2022   PROT 6.9 05/12/2022   ALBUMIN 4.0 05/12/2022   CALCIUM 9.2 05/12/2022   GFR 86.59 05/12/2022   Lab Results  Component Value Date   CHOL 231 (H) 05/12/2022   Lab Results  Component Value Date   HDL 66.00 05/12/2022   Lab Results  Component Value Date   LDLCALC 149 (H) 05/12/2022   Lab Results  Component Value Date   TRIG 81.0 05/12/2022   Lab Results  Component Value Date   CHOLHDL 4 05/12/2022   No results found for: "HGBA1C"    Assessment & Plan:  There are no diagnoses linked to this encounter.  Follow-up: No follow-ups on file.   Kara Dies, NP

## 2023-04-05 DIAGNOSIS — J069 Acute upper respiratory infection, unspecified: Secondary | ICD-10-CM | POA: Insufficient documentation

## 2023-04-05 NOTE — Assessment & Plan Note (Signed)
Persistent cough and chest congestion for two weeks. No fever, ear pain, or shortness of breath. Previous episodes of similar symptoms treated with Azithromycin and Augmentin. Currently using Mucinex DM with partial relief. -Start Doxycycline. -Continue Mucinex DM. -If symptoms do not improve, consider chest x-ray.

## 2023-04-20 ENCOUNTER — Encounter: Payer: Self-pay | Admitting: Nurse Practitioner

## 2023-04-20 ENCOUNTER — Encounter: Payer: Self-pay | Admitting: Internal Medicine

## 2023-04-21 ENCOUNTER — Ambulatory Visit
Admission: RE | Admit: 2023-04-21 | Discharge: 2023-04-21 | Disposition: A | Discharge status: Home / Self Care | Payer: Self-pay | Source: Ambulatory Visit | Attending: Nurse Practitioner

## 2023-04-21 ENCOUNTER — Ambulatory Visit (INDEPENDENT_AMBULATORY_CARE_PROVIDER_SITE_OTHER): Payer: Self-pay | Admitting: Nurse Practitioner

## 2023-04-21 ENCOUNTER — Ambulatory Visit
Admission: RE | Admit: 2023-04-21 | Discharge: 2023-04-21 | Disposition: A | Discharge status: Home / Self Care | Payer: Self-pay | Attending: Interventional Radiology | Admitting: Interventional Radiology

## 2023-04-21 ENCOUNTER — Encounter: Payer: Self-pay | Admitting: Nurse Practitioner

## 2023-04-21 VITALS — BP 122/78 | HR 75 | Temp 98.1°F | Ht 63.0 in | Wt 134.8 lb

## 2023-04-21 DIAGNOSIS — R052 Subacute cough: Secondary | ICD-10-CM

## 2023-04-21 NOTE — Assessment & Plan Note (Signed)
 Cough for over six weeks, productive at times, associated with postnasal drip  and SOB with excretion. No associated chest pain, fever, ear pain. History of childhood asthma. No recent changes in environment or exposure to new allergens.  -Order chest x-ray to rule out any underlying lung pathology. -Discontinue Sudafed due to potential for causing dryness and exacerbating cough. -Start over-the-counter antihistamines (cetirizine/Zyrtec, Xyzal, or fexofenadine/Allegra) to manage postnasal drip. -Consider referral to pulmonology if symptoms not improving.

## 2023-04-21 NOTE — Telephone Encounter (Signed)
 Pt needs to be reevaluated. Please call to schedule an appointment.

## 2023-04-21 NOTE — Patient Instructions (Signed)
 Please go to lab for chest X-ray. For postnasal drip: We will start you on over-the-counter antihistamines like cetirizine (Zyrtec), Xyzal, or fexofenadine (Allegra) to manage this.  Increase hydration. Home remedies such as warm water with honey, lemon, and ginger or cinnamon.

## 2023-04-21 NOTE — Progress Notes (Signed)
 Established Patient Office Visit  Subjective:  Patient ID: Bridget Gordon, female    DOB: November 25, 1960  Age: 63 y.o. MRN: [id number]  CC:  Chief Complaint  Patient presents with   Acute Visit    Chest congestion    HPI  Bridget Gordon presents for cough. She was seen in the office on 04/03/23 for cough and congestion was prescribed doxycyline but she did not initiated the therapy.  She has been experiencing intermittent cough since December. She was seen at [facility] urgent care on 12/03 treated with Azithromycin, prednisone and promethazine. Her symptoms did not improve and was prescribed  Augmentin for 7 days, chest xray negative on 02/18/23.  The symptoms was resolved in December and they returned in mid-January. The cough is mostly productive, occurring at least every hour, and sometimes accompanied by postnasal drip. No chest pain, ear pain, fever, or indigestion.  The cough has affected her ability to run, an activity she used to do three times a week. She now experiences shortness of breath when attempting to run, which was not present before the onset of her symptoms. She has been walking instead but finds she cannot run as she did before. No shortness of breath at rest.  She has a history of asthma as a child but has not experienced asthma symptoms as an adult. She denies any environmental allergies except for occasional spring allergies. No recent weight changes, wheezing, or sore throat. She also denies smoking or exposure to secondhand smoke.  She has been using Mucinex occasionally, which makes her cough more productive. Antihistamines tried in the past dried up her postnasal drip but caused nighttime coughing due to dryness. She has not used any steroids or cough suppressants recently.  HPI   Past Medical History:  Diagnosis Date   Cancer (HCC)    skin   Seasonal allergies     History reviewed. No pertinent surgical history.  Family History  Problem Relation Age of Onset    Heart disease Father        s/p CABG   Skin cancer Father    Skin cancer Mother    Diabetes Maternal Grandmother    Bone cancer Maternal Grandmother    Liver cancer Maternal Grandfather    Breast cancer Neg Hx    Colon cancer Neg Hx     Social History   Socioeconomic History   Marital status: Married    Spouse name: Not on file   Number of children: 7   Years of education: Not on file   Highest education level: Bachelor's degree (e.g., BA, AB, BS)  Occupational History   Not on file  Tobacco Use   Smoking status: Never   Smokeless tobacco: Never  Substance and Sexual Activity   Alcohol use: No    Alcohol/week: 0.0 standard drinks of alcohol   Drug use: Yes    Types: Fentanyl   Sexual activity: Not on file  Other Topics Concern   Not on file  Social History Narrative   Not on file   Social Drivers of Health   Financial Resource Strain: Low Risk  (02/18/2023)   Overall Financial Resource Strain (CARDIA)    Difficulty of Paying Living Expenses: Not hard at all  Food Insecurity: No Food Insecurity (02/18/2023)   Hunger Vital Sign    Worried About Running Out of Food in the Last Year: Never true    Ran Out of Food in the Last Year: Never true  Transportation Needs:  No Transportation Needs (02/18/2023)   PRAPARE - Administrator, Civil Service (Medical): No    Lack of Transportation (Non-Medical): No  Physical Activity: Sufficiently Active (02/18/2023)   Exercise Vital Sign    Days of Exercise per Week: 5 days    Minutes of Exercise per Session: 30 min  Stress: No Stress Concern Present (02/18/2023)   [employer] of Occupational Health - Occupational Stress Questionnaire    Feeling of Stress : Only a little  Social Connections: Socially Integrated (02/18/2023)   Social Connection and Isolation Panel [NHANES]    Frequency of Communication with Friends and Family: More than three times a week    Frequency of Social Gatherings with Friends and Family:  More than three times a week    Attends Religious Services: More than 4 times per year    Active Member of [employer] or Organizations: Yes    Attends Engineer, structural: More than 4 times per year    Marital Status: Married  Catering manager Violence: Not on file     Outpatient Medications Prior to Visit  Medication Sig Dispense Refill   Multiple Vitamin (MULTIVITAMIN) tablet Take 1 tablet by mouth daily.     Omega-3 Fatty Acids (FISH OIL PO) Take by mouth.     UNABLE TO FIND      VITAMIN D, ERGOCALCIFEROL, PO Take by mouth daily.     No facility-administered medications prior to visit.    No Known Allergies  ROS Review of Systems Negative unless indicated in HPI.    Objective:    Physical Exam Constitutional:      Appearance: Normal appearance.  HENT:     Right Ear: Tympanic membrane normal. Tympanic membrane is not erythematous.     Left Ear: Tympanic membrane normal. Tympanic membrane is not erythematous.     Nose:     Right Turbinates: Not enlarged.     Left Turbinates: Not enlarged.     Right Sinus: No maxillary sinus tenderness or frontal sinus tenderness.     Left Sinus: No maxillary sinus tenderness or frontal sinus tenderness.     Mouth/Throat:     Mouth: Mucous membranes are moist.     Pharynx: Postnasal drip present. No pharyngeal swelling, oropharyngeal exudate or posterior oropharyngeal erythema.     Tonsils: No tonsillar exudate.  Cardiovascular:     Rate and Rhythm: Normal rate and regular rhythm.  Pulmonary:     Effort: Pulmonary effort is normal.     Breath sounds: Normal breath sounds. No stridor. No wheezing.  Neurological:     General: No focal deficit present.     Mental Status: She is alert and oriented to person, place, and time. Mental status is at baseline.  Psychiatric:        Mood and Affect: Mood normal.        Behavior: Behavior normal.        Thought Content: Thought content normal.        Judgment: Judgment normal.      BP 122/78   Pulse 75   Temp 98.1 F (36.7 C)   Ht 5\' 3"  (1.6 m)   Wt 134 lb 12.8 oz (61.1 kg)   LMP 10/13/2012   SpO2 99%   BMI 23.88 kg/m  Wt Readings from Last 3 Encounters:  04/21/23 134 lb 12.8 oz (61.1 kg)  04/03/23 133 lb 3.2 oz (60.4 kg)  02/18/23 131 lb 6.4 oz (59.6 kg)  Health Maintenance  Topic Date Due   Zoster Vaccines- Shingrix (1 of 2) Never done   COVID-19 Vaccine (3 - 2024-25 season) 11/02/2022   INFLUENZA VACCINE  06/01/2023 (Originally 10/02/2022)   MAMMOGRAM  09/09/2023   DTaP/Tdap/Td (2 - Td or Tdap) 04/22/2025   Colonoscopy  07/17/2025   Cervical Cancer Screening (HPV/Pap Cotest)  05/11/2026   Hepatitis C Screening  Completed   HIV Screening  Completed   HPV VACCINES  Aged Out    There are no preventive care reminders to display for this patient.  Lab Results  Component Value Date   TSH 0.98 05/12/2022   Lab Results  Component Value Date   WBC 5.3 05/12/2022   HGB 14.1 05/12/2022   HCT 41.1 05/12/2022   MCV 86.1 05/12/2022   PLT 298.0 05/12/2022   Lab Results  Component Value Date   NA 139 05/12/2022   K 3.9 05/12/2022   CO2 29 05/12/2022   GLUCOSE 83 05/12/2022   BUN 11 05/12/2022   CREATININE 0.74 05/12/2022   BILITOT 0.6 05/12/2022   ALKPHOS 62 05/12/2022   AST 21 05/12/2022   ALT 23 05/12/2022   PROT 6.9 05/12/2022   ALBUMIN 4.0 05/12/2022   CALCIUM 9.2 05/12/2022   GFR 86.59 05/12/2022   Lab Results  Component Value Date   CHOL 231 (H) 05/12/2022   Lab Results  Component Value Date   HDL 66.00 05/12/2022   Lab Results  Component Value Date   LDLCALC 149 (H) 05/12/2022   Lab Results  Component Value Date   TRIG 81.0 05/12/2022   Lab Results  Component Value Date   CHOLHDL 4 05/12/2022   No results found for: "HGBA1C"    Assessment & Plan:  Subacute cough Assessment & Plan: Cough for over six weeks, productive at times, associated with postnasal drip  and SOB with excretion. No associated chest  pain, fever, ear pain. History of childhood asthma. No recent changes in environment or exposure to new allergens.  -Order chest x-ray to rule out any underlying lung pathology. -Discontinue Sudafed due to potential for causing dryness and exacerbating cough. -Start over-the-counter antihistamines (cetirizine/Zyrtec, Xyzal, or fexofenadine/Allegra) to manage postnasal drip. -Consider referral to pulmonology if symptoms not improving.  Orders: -     DG Chest 2 View    Follow-up: No follow-ups on file.   Kara Dies, NP

## 2023-04-21 NOTE — Telephone Encounter (Signed)
 Patient is scheduled for 1:00 today with Kara Dies.

## 2023-04-23 ENCOUNTER — Encounter: Payer: Self-pay | Admitting: Nurse Practitioner

## 2023-05-05 ENCOUNTER — Encounter: Payer: Self-pay | Admitting: Nurse Practitioner

## 2023-05-15 ENCOUNTER — Encounter: Payer: Self-pay | Admitting: Internal Medicine

## 2023-05-19 ENCOUNTER — Encounter: Payer: Self-pay | Admitting: Internal Medicine

## 2023-05-27 ENCOUNTER — Encounter: Payer: Self-pay | Admitting: Internal Medicine

## 2023-05-29 NOTE — Telephone Encounter (Signed)
 Biest 0.5 mg 70/30 + 20 mg of progesterone  6 month supply refilled.

## 2023-06-30 ENCOUNTER — Encounter: Payer: Self-pay | Admitting: Internal Medicine

## 2023-06-30 ENCOUNTER — Ambulatory Visit (INDEPENDENT_AMBULATORY_CARE_PROVIDER_SITE_OTHER): Payer: Self-pay | Admitting: Internal Medicine

## 2023-06-30 VITALS — BP 112/60 | HR 64 | Temp 97.7°F | Resp 16 | Ht 63.5 in | Wt 134.8 lb

## 2023-06-30 DIAGNOSIS — F439 Reaction to severe stress, unspecified: Secondary | ICD-10-CM

## 2023-06-30 DIAGNOSIS — Z8639 Personal history of other endocrine, nutritional and metabolic disease: Secondary | ICD-10-CM

## 2023-06-30 DIAGNOSIS — Z1231 Encounter for screening mammogram for malignant neoplasm of breast: Secondary | ICD-10-CM

## 2023-06-30 DIAGNOSIS — Z0000 Encounter for general adult medical examination without abnormal findings: Secondary | ICD-10-CM

## 2023-06-30 DIAGNOSIS — Z13220 Encounter for screening for lipoid disorders: Secondary | ICD-10-CM

## 2023-06-30 NOTE — Progress Notes (Signed)
 Subjective:    Patient ID: Bridget Gordon, female    DOB: 1960/05/27, 63 y.o.   MRN: [id number]  Patient here for  Chief Complaint  Patient presents with   Annual Exam    HPI Here for a physical exam. Recently had issues with persistent cough. Treated with antihistamine. Cxr ok. Symptoms have resolved now. No cough or congestion. No sob reported. Stays active. Has started back running. No abdominal pain or bowel change.    Past Medical History:  Diagnosis Date   Cancer (HCC)    skin   Seasonal allergies    History reviewed. No pertinent surgical history. Family History  Problem Relation Age of Onset   Heart disease Father        s/p CABG   Skin cancer Father    Skin cancer Mother    Diabetes Maternal Grandmother    Bone cancer Maternal Grandmother    Liver cancer Maternal Grandfather    Breast cancer Neg Hx    Colon cancer Neg Hx    Social History   Socioeconomic History   Marital status: Married    Spouse name: Not on file   Number of children: 7   Years of education: Not on file   Highest education level: Bachelor's degree (e.g., BA, AB, BS)  Occupational History   Not on file  Tobacco Use   Smoking status: Never   Smokeless tobacco: Never  Substance and Sexual Activity   Alcohol use: No    Alcohol/week: 0.0 standard drinks of alcohol   Drug use: Yes    Types: Fentanyl   Sexual activity: Not on file  Other Topics Concern   Not on file  Social History Narrative   Not on file   Social Drivers of Health   Financial Resource Strain: Low Risk  (02/18/2023)   Overall Financial Resource Strain (CARDIA)    Difficulty of Paying Living Expenses: Not hard at all  Food Insecurity: No Food Insecurity (02/18/2023)   Hunger Vital Sign    Worried About Running Out of Food in the Last Year: Never true    Ran Out of Food in the Last Year: Never true  Transportation Needs: No Transportation Needs (02/18/2023)   PRAPARE - Administrator, Civil Service  (Medical): No    Lack of Transportation (Non-Medical): No  Physical Activity: Sufficiently Active (02/18/2023)   Exercise Vital Sign    Days of Exercise per Week: 5 days    Minutes of Exercise per Session: 30 min  Stress: No Stress Concern Present (02/18/2023)   [employer] of Occupational Health - Occupational Stress Questionnaire    Feeling of Stress : Only a little  Social Connections: Socially Integrated (02/18/2023)   Social Connection and Isolation Panel [NHANES]    Frequency of Communication with Friends and Family: More than three times a week    Frequency of Social Gatherings with Friends and Family: More than three times a week    Attends Religious Services: More than 4 times per year    Active Member of [employer] or Organizations: Yes    Attends Engineer, structural: More than 4 times per year    Marital Status: Married     Review of Systems  Constitutional:  Negative for appetite change and unexpected weight change.  HENT:  Negative for congestion, sinus pressure and sore throat.   Eyes:  Negative for pain and visual disturbance.  Respiratory:  Negative for cough, chest tightness and shortness of  breath.   Cardiovascular:  Negative for chest pain, palpitations and leg swelling.  Gastrointestinal:  Negative for abdominal pain, diarrhea, nausea and vomiting.  Genitourinary:  Negative for difficulty urinating and dysuria.  Musculoskeletal:  Negative for joint swelling and myalgias.  Skin:  Negative for color change and rash.  Neurological:  Negative for dizziness and headaches.  Hematological:  Negative for adenopathy. Does not bruise/bleed easily.  Psychiatric/Behavioral:  Negative for agitation and dysphoric mood.        Objective:     BP 112/60   Pulse 64   Temp 97.7 F (36.5 C)   Resp 16   Ht 5' 3.5" (1.613 m)   Wt 134 lb 12.8 oz (61.1 kg)   LMP 10/13/2012   SpO2 99%   BMI 23.50 kg/m  Wt Readings from Last 3 Encounters:  06/30/23 134 lb 12.8  oz (61.1 kg)  04/21/23 134 lb 12.8 oz (61.1 kg)  04/03/23 133 lb 3.2 oz (60.4 kg)    Physical Exam Vitals reviewed.  Constitutional:      General: She is not in acute distress.    Appearance: Normal appearance. She is well-developed.  HENT:     Head: Normocephalic and atraumatic.     Right Ear: External ear normal.     Left Ear: External ear normal.     Mouth/Throat:     Pharynx: No oropharyngeal exudate or posterior oropharyngeal erythema.  Eyes:     General: No scleral icterus.       Right eye: No discharge.        Left eye: No discharge.     Conjunctiva/sclera: Conjunctivae normal.  Neck:     Thyroid: No thyromegaly.  Cardiovascular:     Rate and Rhythm: Normal rate and regular rhythm.  Pulmonary:     Effort: No tachypnea, accessory muscle usage or respiratory distress.     Breath sounds: Normal breath sounds. No decreased breath sounds or wheezing.  Chest:  Breasts:    Right: No inverted nipple, mass, nipple discharge or tenderness (no axillary adenopathy).     Left: No inverted nipple, mass, nipple discharge or tenderness (no axilarry adenopathy).  Abdominal:     General: Bowel sounds are normal.     Palpations: Abdomen is soft.     Tenderness: There is no abdominal tenderness.  Musculoskeletal:        General: No swelling or tenderness.     Cervical back: Neck supple.  Lymphadenopathy:     Cervical: No cervical adenopathy.  Skin:    Findings: No erythema or rash.  Neurological:     Mental Status: She is alert and oriented to person, place, and time.  Psychiatric:        Mood and Affect: Mood normal.        Behavior: Behavior normal.         Outpatient Encounter Medications as of 06/30/2023  Medication Sig   Multiple Vitamin (MULTIVITAMIN) tablet Take 1 tablet by mouth daily.   Omega-3 Fatty Acids (FISH OIL PO) Take by mouth.   UNABLE TO FIND    VITAMIN D, ERGOCALCIFEROL, PO Take by mouth daily.   No facility-administered encounter medications on file  as of 06/30/2023.     Lab Results  Component Value Date   WBC 5.3 05/12/2022   HGB 14.1 05/12/2022   HCT 41.1 05/12/2022   PLT 298.0 05/12/2022   GLUCOSE 83 05/12/2022   CHOL 231 (H) 05/12/2022   TRIG 81.0 05/12/2022   HDL 66.00  05/12/2022   LDLCALC 149 (H) 05/12/2022   ALT 23 05/12/2022   AST 21 05/12/2022   NA 139 05/12/2022   K 3.9 05/12/2022   CL 102 05/12/2022   CREATININE 0.74 05/12/2022   BUN 11 05/12/2022   CO2 29 05/12/2022   TSH 0.98 05/12/2022    DG Chest 2 View Result Date: 05/04/2023 CLINICAL DATA:  63 year old female with a history of cough EXAM: CHEST - 2 VIEW COMPARISON:  02/18/2023 FINDINGS: Cardiomediastinal silhouette unchanged in size and contour. No evidence of central vascular congestion. No interlobular septal thickening. No pneumothorax or pleural effusion. Coarsened interstitial markings, with no confluent airspace disease. No acute displaced fracture. Degenerative changes of the spine. IMPRESSION: Negative for acute cardiopulmonary disease Electronically Signed   By: Myrlene Asper D.O.   On: 05/04/2023 10:56       Assessment & Plan:  Routine general medical examination at a health care facility  Screening cholesterol level -     Lipid panel; Future  History of iron deficiency Assessment & Plan: Follow [facility]bc.   Orders: -     TSH; Future -     Comprehensive metabolic panel with GFR; Future -     CBC with Differential/Platelet; Future -     Ferritin; Future  Health care maintenance Assessment & Plan: Physical today 06/30/23. PAP 05/10/21 - negative with atrophic changes.  Negative HPV.  Mammogram 09/09/22- [facility].  Mammogram scheduled for 09/2023. Colonoscopy 07/2015. Follow up 10 years.   Orders: -     3D Screening Mammogram, Left and Right; Future  Encounter for screening mammogram for malignant neoplasm of breast  Stress Assessment & Plan: Overall doing well. Follow.       Dellar Fenton, MD

## 2023-06-30 NOTE — Assessment & Plan Note (Signed)
 Follow cbc.

## 2023-06-30 NOTE — Assessment & Plan Note (Signed)
Overall doing well.  Follow.   

## 2023-06-30 NOTE — Assessment & Plan Note (Addendum)
 Physical today 06/30/23. PAP 05/10/21 - negative with atrophic changes.  Negative HPV.  Mammogram 09/09/22- [facility]ads I.  Mammogram scheduled for 09/2023. Colonoscopy 07/2015. Follow up 10 years.

## 2023-07-08 ENCOUNTER — Other Ambulatory Visit (INDEPENDENT_AMBULATORY_CARE_PROVIDER_SITE_OTHER): Payer: Self-pay

## 2023-07-08 ENCOUNTER — Other Ambulatory Visit: Payer: Self-pay | Admitting: Internal Medicine

## 2023-07-08 DIAGNOSIS — Z13220 Encounter for screening for lipoid disorders: Secondary | ICD-10-CM

## 2023-07-08 DIAGNOSIS — E7800 Pure hypercholesterolemia, unspecified: Secondary | ICD-10-CM | POA: Insufficient documentation

## 2023-07-08 DIAGNOSIS — Z8639 Personal history of other endocrine, nutritional and metabolic disease: Secondary | ICD-10-CM

## 2023-07-08 LAB — LIPID PANEL
Cholesterol: 252 mg/dL — ABNORMAL HIGH (ref 0–200)
HDL: 66.8 mg/dL (ref >39.00)
LDL Cholesterol: 176 mg/dL — ABNORMAL HIGH (ref 0–99)
NonHDL: 185.21
Total CHOL/HDL Ratio: 4
Triglycerides: 48 mg/dL (ref 0.0–149.0)
VLDL: 9.6 mg/dL (ref 0.0–40.0)

## 2023-07-08 LAB — COMPREHENSIVE METABOLIC PANEL WITH GFR
ALT: 25 U/L (ref 0–35)
AST: 24 U/L (ref 0–37)
Albumin: 4.4 g/dL (ref 3.5–5.2)
Alkaline Phosphatase: 75 U/L (ref 39–117)
BUN: 13 mg/dL (ref 6–23)
CO2: 27 meq/L (ref 19–32)
Calcium: 9 mg/dL (ref 8.4–10.5)
Chloride: 103 meq/L (ref 96–112)
Creatinine, Ser: 0.76 mg/dL (ref 0.40–1.20)
GFR: 83.19 mL/min (ref >60.00)
Glucose, Bld: 81 mg/dL (ref 70–99)
Potassium: 4.1 meq/L (ref 3.5–5.1)
Sodium: 138 meq/L (ref 135–145)
Total Bilirubin: 0.7 mg/dL (ref 0.2–1.2)
Total Protein: 6.8 g/dL (ref 6.0–8.3)

## 2023-07-08 LAB — CBC WITH DIFFERENTIAL/PLATELET
Basophils Absolute: 0 10*3/uL (ref 0.0–0.1)
Basophils Relative: 0.8 % (ref 0.0–3.0)
Eosinophils Absolute: 0.4 10*3/uL (ref 0.0–0.7)
Eosinophils Relative: 7.8 % — ABNORMAL HIGH (ref 0.0–5.0)
HCT: 41.7 % (ref 36.0–46.0)
Hemoglobin: 14 g/dL (ref 12.0–15.0)
Lymphocytes Relative: 30.6 % (ref 12.0–46.0)
Lymphs Abs: 1.5 10*3/uL (ref 0.7–4.0)
MCHC: 33.5 g/dL (ref 30.0–36.0)
MCV: 86.5 fl (ref 78.0–100.0)
Monocytes Absolute: 0.4 10*3/uL (ref 0.1–1.0)
Monocytes Relative: 8.1 % (ref 3.0–12.0)
Neutro Abs: 2.5 10*3/uL (ref 1.4–7.7)
Neutrophils Relative %: 52.7 % (ref 43.0–77.0)
Platelets: 300 10*3/uL (ref 150.0–400.0)
RBC: 4.82 Mil/uL (ref 3.87–5.11)
RDW: 12.5 % (ref 11.5–15.5)
WBC: 4.8 10*3/uL (ref 4.0–10.5)

## 2023-07-08 LAB — FERRITIN: Ferritin: 48.8 ng/mL (ref 10.0–291.0)

## 2023-07-08 LAB — TSH: TSH: 1.91 u[IU]/mL (ref 0.35–5.50)

## 2023-07-08 NOTE — Progress Notes (Signed)
Order placed for f/u lab.   

## 2023-09-11 ENCOUNTER — Ambulatory Visit
Admission: RE | Admit: 2023-09-11 | Discharge: 2023-09-11 | Disposition: A | Discharge status: Home / Self Care | Payer: Self-pay | Source: Ambulatory Visit | Attending: Internal Medicine | Admitting: Internal Medicine

## 2023-09-11 DIAGNOSIS — Z1231 Encounter for screening mammogram for malignant neoplasm of breast: Secondary | ICD-10-CM | POA: Insufficient documentation

## 2023-09-11 DIAGNOSIS — Z0000 Encounter for general adult medical examination without abnormal findings: Secondary | ICD-10-CM

## 2024-01-05 ENCOUNTER — Other Ambulatory Visit (INDEPENDENT_AMBULATORY_CARE_PROVIDER_SITE_OTHER): Payer: Self-pay

## 2024-01-05 DIAGNOSIS — E7800 Pure hypercholesterolemia, unspecified: Secondary | ICD-10-CM

## 2024-01-05 LAB — LIPID PANEL
Cholesterol: 256 mg/dL — ABNORMAL HIGH (ref 0–200)
HDL: 64.2 mg/dL (ref >39.00)
LDL Cholesterol: 180 mg/dL — ABNORMAL HIGH (ref 0–99)
NonHDL: 191.48
Total CHOL/HDL Ratio: 4
Triglycerides: 57 mg/dL (ref 0.0–149.0)
VLDL: 11.4 mg/dL (ref 0.0–40.0)

## 2024-01-06 ENCOUNTER — Ambulatory Visit: Payer: Self-pay | Admitting: Internal Medicine

## 2024-01-08 NOTE — Progress Notes (Signed)
 Last read by Montie LITTIE Brinda Dorthea at 3:50PM on 01/06/2024.

## 2024-02-13 ENCOUNTER — Encounter: Payer: Self-pay | Admitting: Internal Medicine

## 2024-02-15 NOTE — Telephone Encounter (Signed)
 Need faxed rx from [facility] to sign.

## 2024-06-30 ENCOUNTER — Encounter: Payer: Self-pay | Admitting: Internal Medicine
